# Patient Record
Sex: Female | Born: 1955 | Race: White | Hispanic: No | Marital: Married | Smoking: Never smoker
Health system: Southern US, Community
[De-identification: ages and names within clinical notes are randomized; demographics above are authoritative.]

## PROBLEM LIST (undated history)

## (undated) DIAGNOSIS — C73 Malignant neoplasm of thyroid gland: Secondary | ICD-10-CM

## (undated) DIAGNOSIS — I10 Essential (primary) hypertension: Secondary | ICD-10-CM

## (undated) HISTORY — PX: TOTAL THYROIDECTOMY: SHX2547

## (undated) HISTORY — PX: CHOLECYSTECTOMY: SHX55

## (undated) HISTORY — PX: ABDOMINAL HYSTERECTOMY: SHX81

## (undated) HISTORY — PX: THYROIDECTOMY: SHX17

---

## 1997-10-25 HISTORY — PX: ABDOMINAL HYSTERECTOMY: SHX81

## 1998-01-16 ENCOUNTER — Ambulatory Visit (HOSPITAL_COMMUNITY): Admission: RE | Admit: 1998-01-16 | Discharge: 1998-01-16 | Payer: Self-pay | Admitting: Obstetrics & Gynecology

## 1998-07-24 ENCOUNTER — Ambulatory Visit (HOSPITAL_COMMUNITY): Admission: RE | Admit: 1998-07-24 | Discharge: 1998-07-24 | Payer: Self-pay | Admitting: Obstetrics & Gynecology

## 1998-12-29 ENCOUNTER — Other Ambulatory Visit: Admission: RE | Admit: 1998-12-29 | Discharge: 1998-12-29 | Payer: Self-pay | Admitting: Obstetrics & Gynecology

## 1999-12-03 ENCOUNTER — Encounter: Payer: Self-pay | Admitting: Obstetrics & Gynecology

## 1999-12-03 ENCOUNTER — Ambulatory Visit (HOSPITAL_COMMUNITY): Admission: RE | Admit: 1999-12-03 | Discharge: 1999-12-03 | Payer: Self-pay | Admitting: Obstetrics & Gynecology

## 2000-02-24 ENCOUNTER — Other Ambulatory Visit: Admission: RE | Admit: 2000-02-24 | Discharge: 2000-02-24 | Payer: Self-pay | Admitting: Obstetrics & Gynecology

## 2001-06-21 ENCOUNTER — Encounter: Payer: Self-pay | Admitting: Obstetrics & Gynecology

## 2001-06-21 ENCOUNTER — Ambulatory Visit (HOSPITAL_COMMUNITY): Admission: RE | Admit: 2001-06-21 | Discharge: 2001-06-21 | Payer: Self-pay | Admitting: Obstetrics & Gynecology

## 2001-07-05 ENCOUNTER — Other Ambulatory Visit: Admission: RE | Admit: 2001-07-05 | Discharge: 2001-07-05 | Payer: Self-pay | Admitting: Obstetrics & Gynecology

## 2002-07-17 ENCOUNTER — Encounter: Payer: Self-pay | Admitting: Obstetrics & Gynecology

## 2002-07-17 ENCOUNTER — Ambulatory Visit (HOSPITAL_COMMUNITY): Admission: RE | Admit: 2002-07-17 | Discharge: 2002-07-17 | Payer: Self-pay | Admitting: Obstetrics & Gynecology

## 2002-08-06 ENCOUNTER — Other Ambulatory Visit: Admission: RE | Admit: 2002-08-06 | Discharge: 2002-08-06 | Payer: Self-pay | Admitting: Obstetrics & Gynecology

## 2003-01-03 ENCOUNTER — Encounter (INDEPENDENT_AMBULATORY_CARE_PROVIDER_SITE_OTHER): Payer: Self-pay

## 2003-01-03 ENCOUNTER — Observation Stay (HOSPITAL_COMMUNITY): Admission: RE | Admit: 2003-01-03 | Discharge: 2003-01-04 | Payer: Self-pay | Admitting: Obstetrics & Gynecology

## 2003-07-19 ENCOUNTER — Encounter: Payer: Self-pay | Admitting: Obstetrics & Gynecology

## 2003-07-19 ENCOUNTER — Ambulatory Visit (HOSPITAL_COMMUNITY): Admission: RE | Admit: 2003-07-19 | Discharge: 2003-07-19 | Payer: Self-pay | Admitting: Obstetrics & Gynecology

## 2003-09-03 ENCOUNTER — Other Ambulatory Visit: Admission: RE | Admit: 2003-09-03 | Discharge: 2003-09-03 | Payer: Self-pay | Admitting: Obstetrics & Gynecology

## 2004-05-30 ENCOUNTER — Emergency Department (HOSPITAL_COMMUNITY): Admission: EM | Admit: 2004-05-30 | Discharge: 2004-05-30 | Payer: Self-pay | Admitting: Emergency Medicine

## 2004-11-13 ENCOUNTER — Ambulatory Visit (HOSPITAL_COMMUNITY): Admission: RE | Admit: 2004-11-13 | Discharge: 2004-11-13 | Payer: Self-pay | Admitting: Obstetrics & Gynecology

## 2004-12-11 ENCOUNTER — Other Ambulatory Visit: Admission: RE | Admit: 2004-12-11 | Discharge: 2004-12-11 | Payer: Self-pay | Admitting: Obstetrics & Gynecology

## 2005-03-12 ENCOUNTER — Encounter: Admission: RE | Admit: 2005-03-12 | Discharge: 2005-03-12 | Payer: Self-pay | Admitting: Emergency Medicine

## 2005-12-21 ENCOUNTER — Ambulatory Visit (HOSPITAL_COMMUNITY): Admission: RE | Admit: 2005-12-21 | Discharge: 2005-12-21 | Payer: Self-pay | Admitting: Obstetrics & Gynecology

## 2006-02-07 ENCOUNTER — Other Ambulatory Visit: Admission: RE | Admit: 2006-02-07 | Discharge: 2006-02-07 | Payer: Self-pay | Admitting: Obstetrics & Gynecology

## 2007-01-17 ENCOUNTER — Ambulatory Visit (HOSPITAL_COMMUNITY): Admission: RE | Admit: 2007-01-17 | Discharge: 2007-01-17 | Payer: Self-pay | Admitting: Obstetrics & Gynecology

## 2007-08-03 ENCOUNTER — Encounter: Admission: RE | Admit: 2007-08-03 | Discharge: 2007-08-03 | Payer: Self-pay | Admitting: Emergency Medicine

## 2008-03-29 ENCOUNTER — Ambulatory Visit (HOSPITAL_COMMUNITY): Admission: RE | Admit: 2008-03-29 | Discharge: 2008-03-29 | Payer: Self-pay | Admitting: Obstetrics & Gynecology

## 2010-07-14 ENCOUNTER — Encounter (HOSPITAL_COMMUNITY)
Admission: RE | Admit: 2010-07-14 | Discharge: 2010-10-12 | Payer: Self-pay | Source: Home / Self Care | Attending: Internal Medicine | Admitting: Internal Medicine

## 2010-07-28 ENCOUNTER — Other Ambulatory Visit: Admission: RE | Admit: 2010-07-28 | Discharge: 2010-07-28 | Payer: Self-pay | Admitting: Interventional Radiology

## 2010-07-28 ENCOUNTER — Encounter: Admission: RE | Admit: 2010-07-28 | Discharge: 2010-07-28 | Payer: Self-pay | Admitting: Internal Medicine

## 2010-09-28 ENCOUNTER — Observation Stay (HOSPITAL_COMMUNITY)
Admission: RE | Admit: 2010-09-28 | Discharge: 2010-09-29 | Payer: Self-pay | Source: Home / Self Care | Admitting: Surgery

## 2010-09-28 ENCOUNTER — Encounter (INDEPENDENT_AMBULATORY_CARE_PROVIDER_SITE_OTHER): Payer: Self-pay | Admitting: Surgery

## 2010-10-28 LAB — BASIC METABOLIC PANEL
BUN: 8 mg/dL (ref 6–23)
CO2: 31 mEq/L (ref 19–32)
Calcium: 9.2 mg/dL (ref 8.4–10.5)
Chloride: 101 mEq/L (ref 96–112)
Creatinine, Ser: 0.64 mg/dL (ref 0.4–1.2)
GFR calc Af Amer: >60 mL/min (ref >60)
GFR calc non Af Amer: >60 mL/min (ref >60)
Glucose, Bld: 91 mg/dL (ref 70–99)
Potassium: 3.9 mEq/L (ref 3.5–5.1)
Sodium: 138 mEq/L (ref 135–145)

## 2010-10-28 LAB — CBC
HCT: 37 % (ref 36.0–46.0)
Hemoglobin: 11.9 g/dL — ABNORMAL LOW (ref 12.0–15.0)
MCH: 29 pg (ref 26.0–34.0)
MCHC: 32.2 g/dL (ref 30.0–36.0)
MCV: 90.2 fL (ref 78.0–100.0)
Platelets: 233 10*3/uL (ref 150–400)
RBC: 4.1 MIL/uL (ref 3.87–5.11)
RDW: 13 % (ref 11.5–15.5)
WBC: 6 10*3/uL (ref 4.0–10.5)

## 2010-10-30 ENCOUNTER — Ambulatory Visit (HOSPITAL_COMMUNITY)
Admission: RE | Admit: 2010-10-30 | Discharge: 2010-10-31 | Payer: Self-pay | Source: Home / Self Care | Attending: Surgery | Admitting: Surgery

## 2010-10-30 ENCOUNTER — Encounter (INDEPENDENT_AMBULATORY_CARE_PROVIDER_SITE_OTHER): Payer: Self-pay | Admitting: Surgery

## 2010-11-09 LAB — CALCIUM
Calcium: 8.4 mg/dL (ref 8.4–10.5)
Calcium: 8.3 mg/dL — ABNORMAL LOW (ref 8.4–10.5)

## 2010-11-15 ENCOUNTER — Encounter: Payer: Self-pay | Admitting: Emergency Medicine

## 2010-11-15 NOTE — Op Note (Signed)
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  PROCEDURE:  Completion thyroidectomy (right thyroid lobe and isthmus).  SURGEON:  Velora Heckler, MD  ANESTHESIA:  General per Dr. Judie Petit.  ESTIMATED BLOOD LOSS:  Minimal.  PREPARATION:  ChloraPrep.  COMPLICATIONS:  None.  INDICATIONS:  The patient is a 55 year old white female from [place], [place].  The patient had undergone resection of a large left thyroid mass 1 month ago.  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  After review with the pathologist and the medical specialist, a decision was made to proceed with completion thyroidectomy in anticipation of adjuvant treatment with radioactive iodine.  The patient now comes to surgery for completion thyroidectomy.  BODY OF REPORT:  Procedure was done in OR #3 at the [facility].  The patient was brought to the operating room and placed in supine position on the operating room table.  Following administration of general anesthesia, the patient was positioned and then prepped and draped in the usual strict aseptic fashion.  After ascertaining that an adequate level of anesthesia had been achieved, the patient's previous Kocher incision was reopened with a #15 blade. Dissection was carried through the subcutaneous tissues.  Suture material was divided.  Subplatysmal space was reopened and of a Mahorner self-retaining retractor was placed for exposure.  Strap muscles  were incised in the midline.  Dissection was begun on the right side.  Strap muscles were reflected laterally.  Right thyroid lobe was exposed. Gland was gently mobilized with blunt dissection.  Superior pole vessels were divided between small and medium hemoclips with the harmonic scalpel.  Superior parathyroid gland was identified and preserved on its vascular pedicle.  Branches of the inferior thyroid artery were divided between small Ligaclips with the harmonic scalpel.  Inferior venous tributaries were divided between small and medium Ligaclips with the harmonic scalpel.  Inferior parathyroid gland was also positively identified and preserved.  Inferior thyroid artery was dissected out and the trunk of the artery was divided between medium Ligaclips with the harmonic scalpel.  Recurrent laryngeal nerve was identified and preserved.  Ligament of Allyson Sabal was released with the electrocautery and the lobe was mobilized up and onto the anterior trachea.  Isthmus was mobilized across the midline to an area of scar on the left trachea consistent with the site of previous resection.  The entire right lobe and isthmus was resected and submitted as specimen to Pathology for review.  Neck was irrigated with warm saline.  Surgicel was placed in the operative field.  Strap muscles were reapproximated in the midline with interrupted 3-0 Vicryl sutures.  Platysma was  closed with interrupted 3- 0 Vicryl sutures.  Skin was closed with a running 4-0 Monocryl subcuticular suture.  Wound was washed and dried, and benzoin and Steri- Strips were applied.  Sterile dressings were applied.  The patient was awakened from anesthesia and brought to the recovery room.  The patient tolerated the procedure well.     Velora Heckler, MD     TMG/MEDQ  D:  10/30/2010  T:  10/31/2010  Job:  [id number]  cc:   Larina Earthly, M.D. Tera Mater. Evlyn Kanner, M.D.  Electronically Signed by Darnell Level MD on 11/15/2010 09:59:12  AM

## 2010-11-19 LAB — SURGICAL PCR SCREEN
MRSA, PCR: NEGATIVE
Staphylococcus aureus: NEGATIVE

## 2010-12-24 ENCOUNTER — Other Ambulatory Visit (HOSPITAL_COMMUNITY): Payer: Self-pay | Admitting: Internal Medicine

## 2010-12-24 DIAGNOSIS — C73 Malignant neoplasm of thyroid gland: Secondary | ICD-10-CM

## 2010-12-29 ENCOUNTER — Encounter (HOSPITAL_COMMUNITY)
Admission: RE | Admit: 2010-12-29 | Discharge: 2010-12-29 | Disposition: A | Discharge status: Home / Self Care | Payer: BC Managed Care – PPO | Source: Ambulatory Visit | Attending: Internal Medicine | Admitting: Internal Medicine

## 2010-12-29 DIAGNOSIS — C73 Malignant neoplasm of thyroid gland: Secondary | ICD-10-CM | POA: Insufficient documentation

## 2011-01-01 ENCOUNTER — Encounter (HOSPITAL_COMMUNITY): Payer: Self-pay

## 2011-01-01 ENCOUNTER — Other Ambulatory Visit (HOSPITAL_COMMUNITY): Payer: Self-pay | Admitting: Internal Medicine

## 2011-01-01 ENCOUNTER — Encounter (HOSPITAL_COMMUNITY)
Admission: RE | Admit: 2011-01-01 | Discharge: 2011-01-01 | Disposition: A | Discharge status: Home / Self Care | Payer: BC Managed Care – PPO | Source: Ambulatory Visit | Attending: Internal Medicine | Admitting: Internal Medicine

## 2011-01-01 DIAGNOSIS — C73 Malignant neoplasm of thyroid gland: Secondary | ICD-10-CM

## 2011-01-01 DIAGNOSIS — E0789 Other specified disorders of thyroid: Secondary | ICD-10-CM | POA: Insufficient documentation

## 2011-01-01 HISTORY — DX: Malignant neoplasm of thyroid gland: C73

## 2011-01-01 MED ORDER — SODIUM IODIDE I 131 CAPSULE
3.2000 | Freq: Once | INTRAVENOUS | Status: AC | PRN
  Administered 2010-12-31: 3.2 via ORAL

## 2011-01-05 LAB — DIFFERENTIAL
Basophils Absolute: 0 10*3/uL (ref 0.0–0.1)
Basophils Relative: 0 % (ref 0–1)
Eosinophils Absolute: 0.1 10*3/uL (ref 0.0–0.7)
Eosinophils Relative: 2 % (ref 0–5)
Lymphocytes Relative: 26 % (ref 12–46)
Lymphs Abs: 1.6 10*3/uL (ref 0.7–4.0)
Monocytes Absolute: 0.4 10*3/uL (ref 0.1–1.0)
Monocytes Relative: 7 % (ref 3–12)
Neutro Abs: 4.1 10*3/uL (ref 1.7–7.7)
Neutrophils Relative %: 66 % (ref 43–77)

## 2011-01-05 LAB — BASIC METABOLIC PANEL
BUN: 9 mg/dL (ref 6–23)
CO2: 30 mEq/L (ref 19–32)
Calcium: 9.2 mg/dL (ref 8.4–10.5)
Chloride: 98 mEq/L (ref 96–112)
Creatinine, Ser: 0.59 mg/dL (ref 0.4–1.2)
GFR calc Af Amer: >60 mL/min (ref >60)
GFR calc non Af Amer: >60 mL/min (ref >60)
Glucose, Bld: 95 mg/dL (ref 70–99)
Potassium: 3.5 mEq/L (ref 3.5–5.1)
Sodium: 137 mEq/L (ref 135–145)

## 2011-01-05 LAB — CBC
HCT: 34.8 % — ABNORMAL LOW (ref 36.0–46.0)
Hemoglobin: 11.9 g/dL — ABNORMAL LOW (ref 12.0–15.0)
MCH: 30.2 pg (ref 26.0–34.0)
MCHC: 34.2 g/dL (ref 30.0–36.0)
MCV: 88.4 fL (ref 78.0–100.0)
Platelets: 255 10*3/uL (ref 150–400)
RBC: 3.94 MIL/uL (ref 3.87–5.11)
RDW: 12.3 % (ref 11.5–15.5)
WBC: 6.2 10*3/uL (ref 4.0–10.5)

## 2011-01-05 LAB — SURGICAL PCR SCREEN
MRSA, PCR: NEGATIVE
Staphylococcus aureus: NEGATIVE

## 2011-01-05 LAB — PROTIME-INR
INR: 0.94 (ref 0.00–1.49)
Prothrombin Time: 12.8 seconds (ref 11.6–15.2)

## 2011-01-05 LAB — URINALYSIS, ROUTINE W REFLEX MICROSCOPIC
Bilirubin Urine: NEGATIVE
Glucose, UA: NEGATIVE mg/dL
Hgb urine dipstick: NEGATIVE
Ketones, ur: NEGATIVE mg/dL
Nitrite: NEGATIVE
Protein, ur: NEGATIVE mg/dL
Specific Gravity, Urine: 1.016 (ref 1.005–1.030)
Urobilinogen, UA: 0.2 mg/dL (ref 0.0–1.0)
pH: 6 (ref 5.0–8.0)

## 2011-01-06 ENCOUNTER — Encounter (HOSPITAL_COMMUNITY)
Admission: RE | Admit: 2011-01-06 | Discharge: 2011-01-06 | Disposition: A | Discharge status: Home / Self Care | Payer: BC Managed Care – PPO | Source: Ambulatory Visit | Attending: Internal Medicine | Admitting: Internal Medicine

## 2011-01-06 ENCOUNTER — Encounter (HOSPITAL_COMMUNITY): Payer: Self-pay

## 2011-01-06 DIAGNOSIS — C73 Malignant neoplasm of thyroid gland: Secondary | ICD-10-CM | POA: Insufficient documentation

## 2011-01-06 MED ORDER — SODIUM IODIDE I 131 CAPSULE
150.0000 | Freq: Once | INTRAVENOUS | Status: AC | PRN
  Administered 2011-01-06: 150.4 via ORAL

## 2011-01-15 ENCOUNTER — Ambulatory Visit (HOSPITAL_COMMUNITY)
Admission: RE | Admit: 2011-01-15 | Discharge: 2011-01-15 | Disposition: A | Discharge status: Home / Self Care | Payer: BC Managed Care – PPO | Source: Ambulatory Visit | Attending: Internal Medicine | Admitting: Internal Medicine

## 2011-01-15 DIAGNOSIS — C73 Malignant neoplasm of thyroid gland: Secondary | ICD-10-CM | POA: Insufficient documentation

## 2011-03-12 NOTE — Op Note (Signed)
NAME:  Yvonne Morris, Yvonne Morris                        ACCOUNT NO.:  [id number]   MEDICAL RECORD NO.:  [id number]                   PATIENT TYPE:  OBV   LOCATION:  9322                                 [facility]   PHYSICIAN:  Freddy Finner, M.D.                DATE OF BIRTH:  1956/09/19   DATE OF PROCEDURE:  01/03/2003  DATE OF DISCHARGE:                                 OPERATIVE REPORT   PREOPERATIVE DIAGNOSES:  Fibroids, dysfunctional bleeding.   POSTOPERATIVE DIAGNOSES:  Fibroids, dysfunctional bleeding, the weight of  the uterus was 298 g.   OPERATIVE PROCEDURE:  Laparoscopically assisted vaginal  hysterectomy/bilateral salpingo-oophorectomy.   SURGEON:  Freddy Finner, M.D.   ASSISTANT:  Tracie Harrier, M.D.   ANESTHESIA:  General endotracheal.   ESTIMATED INTRAOPERATIVE BLOOD LOSS:  100 mL   INTRAOPERATIVE COMPLICATIONS:  None.   PRESENT ILLNESS:  Details of the present illness recorded in admission note.   DESCRIPTION OF PROCEDURE:  The patient was admitted on the morning of  surgery.  She was placed in PAS hose.  She was given an antibiotic bolus.  She was brought to the operating room, placed under adequate general  anesthesia, placed in dorsal lithotomy position using the [place] stirrup  system.  Betadine prep of abdomen, perineum and vagina was carried out in  the usual fashion with scrub followed by solution.  Hawkin tenaculum was  attached to the cervix under direct visualization.  Bladder was evacuated  with a Robinson catheter.  Sterile drapes were applied.  Two small incisions  were made, one at the umbilicus, one just above the symphysis through the  umbilical incision and 11 mm disposable non__________ trocar was introduced  while elevating the anterior abdominal wall manually.  Direct inspection  revealed adequate placement with no evidence of injury on entry.  Hemoperitoneum was allowed to accumulate into carbon dioxide gas.  A 5 mm  trocar was placed  in the lower incision under direct visualization.  Third,  a grasping forceps was used, systematic examination of the bowel and pelvic  contents was carried out, no abdominal abnormalities were noted including a  normal appendix.  The uterus was enlarged, the tubes and ovaries were  normal, there was a small peritoneal cyst on the right uterosacral ligament  which is photographed and retained in the office record along with other  findings and this was not removed.  The Gyrus bipolar cutting forceps was  used through the operating channel of the laparoscope and the  infundibulopelvic and upper abdominal ligaments were progressively  fulgurated and sharply divided to free the tubes and ovaries.  This was  carried down to the level just above the uterine arteries.  Attention was  then turned vaginally.  Posterior wedge retractor was placed.  Cervix was  grasped with Christella Hartigan tenaculum after removing the Hawkin tenaculum.  Colpotomy incision was made while turning the uterus  posterior to the  cervix.  Cervix was circumscribed with a scalpel.  Uterosacral pedicles were  taken with the Gyrus Heaney-type clamp, sealed and divided.  Lateral pillars  were taken separately, sealed and divided.  Bladder was carefully advanced  off the cervix.  Cardinal ligament and pedicles were taken, sealed and  divided.  Anterior cul-de-sac was entered and this was confirmed.  Uterine  artery pedicles were then taken with the Gyrus system as well as the pedicle  just above the uterine arteries.  The uterus was then delivered through the  vaginal introitus without significant difficulty.  The weight of the uterus  was 298 g.  A remaining pedicle was sealed and divided.  Angles of the  vagina were then anchored to uterosacrals with a mattress suture of 0-  Monocryl.  Uterosacrals were plicated.  Posterior peritoneum closed with an  interrupted 0-Monocryl.  Cuff was closed vertically with figure-of-eight 0-  Monocryl.   Reinspection laparoscopically was then carried out using the  [place] irrigating system.  Pedicles were irrigated, complete hemostasis was  noted, all the irrigation instruments were aspirated from the abdomen.  The  gas was allowed to escape from the abdomen, instruments were removed, the  patient was given a 30 mg bolus of Toradol IV, 8 mL of half percent plain  Marcaine were injected into the incision sites.  The incisions were closed  with interrupted subcuticular sutures and 3-0 Dexon.  Steri-strips were  applied to the lower incision.  The patient tolerated the operative  procedure well and was awakened and taken to recovery in good condition.                                               Freddy Finner, M.D.    WRN/MEDQ  D:  01/03/2003  T:  01/03/2003  Job:  [id number]

## 2011-03-12 NOTE — H&P (Signed)
NAME:  Yvonne Morris, Yvonne Morris                        ACCOUNT NO.:  [id number]   MEDICAL RECORD NO.:  [id number]                   PATIENT TYPE:  AMB   LOCATION:  SDC                                  [facility]   PHYSICIAN:  Freddy Finner, M.D.                DATE OF BIRTH:  [birth year]   DATE OF ADMISSION:  DATE OF DISCHARGE:                                HISTORY & PHYSICAL   ADMISSION DIAGNOSES:  1. Multiple uterine myomata.  2. Menorrhagia, unresponsive to prolonged use of oral contraceptives with     irregular monthly cycle and prolonged continuous use of active pills.   HISTORY OF PRESENT ILLNESS:  The patient is a 55 year old white married  female, Gravida I, Para I, who has had extensive problems with menorrhagia  and vasomotor symptoms. She was diagnosed with hypothyroidism and a nodule  and is currently under treatment with Dr. Bing Matter in [place] and is  treated with Synthroid. She is also known to have hypertension for which she  is treated with Prinizide. She also is taking Paxil 20 mg per day for mild  depression. She has been managed in my office with continuous oral  contraceptive use and with intermittent routine cyclic oral contraceptives  with no resolution of her menorrhagia, in spite of her good control of  thyroid function at this point in time. She has been documented by  ultrasound to have numerous fibroids including some as large as 2.5 cm. She  has requested definitive surgical intervention. She is admitted at this time  for laparoscopically assisted vaginal hysterectomy and bilateral salpingo-  oophorectomy.   REVIEW OF SYSTEMS:  Otherwise negative with no cardiopulmonary, GI or GU  complaints.   PAST MEDICAL HISTORY:  Remarkable for as above. She has had one vaginal  delivery in 1988. She has no other non-medical conditions other than those  presented in the history of present illness. She has never had a blood  transfusion.   CURRENT MEDICATIONS:   Are all listed above also.   SOCIAL HISTORY:  She is not a cigarette smoker.   FAMILY HISTORY:  Noncontributory.   PHYSICAL EXAMINATION:  HEENT: Grossly within normal limits.  NECK: Thyroid gland is palpable and enlarged with nodularity on the left.  VITAL SIGNS: Blood pressure in the office is 140/94.  BREAST: Examination normal. No palpable masses. No skin changes or nipple  discharge.  CHEST: Clear to auscultation throughout.  HEART: Normal sinus rhythm without murmur, rub, or gallop.  ABDOMEN: Soft and nontender. No appreciable organomegaly. The patient is  moderately obese.  PELVIC: Examination reveals the uterus to be slightly enlarged by bimanual  palpation but the examination was compromised by body habitus. There are no  palpable rectal lesions. No palpable adnexal lesions.  EXTREMITIES: Without clubbing, cyanosis, or edema.    IMPRESSION:  Uterine leiomyomata, menorrhagia, unresponsive to conservative  hormonal therapy. Pelvic ultrasound shows numerous intramural  and subserosal  myomas but no submucous myomas and no apparent abnormality of the  endometrium.   PLAN:  Laparoscopic assisted vaginal hysterectomy with bilateral salpingo-  oophorectomy.                                               Freddy Finner, M.D.    WRN/MEDQ  D:  01/02/2003  T:  01/02/2003  Job:  [id number]

## 2011-08-18 ENCOUNTER — Encounter (INDEPENDENT_AMBULATORY_CARE_PROVIDER_SITE_OTHER): Payer: Self-pay | Admitting: Surgery

## 2011-08-23 ENCOUNTER — Encounter (INDEPENDENT_AMBULATORY_CARE_PROVIDER_SITE_OTHER): Payer: Self-pay | Admitting: Surgery

## 2011-08-30 ENCOUNTER — Encounter (INDEPENDENT_AMBULATORY_CARE_PROVIDER_SITE_OTHER): Payer: Self-pay | Admitting: Surgery

## 2011-08-30 ENCOUNTER — Ambulatory Visit (INDEPENDENT_AMBULATORY_CARE_PROVIDER_SITE_OTHER): Payer: BC Managed Care – PPO | Admitting: Surgery

## 2011-08-30 VITALS — BP 146/90 | HR 76 | Temp 98.0°F | Resp 16 | Ht 67.0 in | Wt 203.4 lb

## 2011-08-30 DIAGNOSIS — D497 Neoplasm of unspecified behavior of endocrine glands and other parts of nervous system: Secondary | ICD-10-CM

## 2011-08-30 DIAGNOSIS — D34 Benign neoplasm of thyroid gland: Secondary | ICD-10-CM | POA: Insufficient documentation

## 2011-08-30 NOTE — Patient Instructions (Signed)
  COCOA BUTTER & VITAMIN E CREAM  ([employer]mer[employer] or other brand)  Apply cocoa butter/vitamin E cream to your incision 2 - 3 times daily.  Massage cream into incision for one minute with each application.  Use sunscreen (50 SPF or higher) for first 6 months after surgery.  You may substitute Mederma or other scar reducing creams as desired.   

## 2011-08-30 NOTE — Progress Notes (Signed)
Visit Diagnoses: 1. Hurthle cell neoplasm of thyroid, minimally invasive     HISTORY: Patient is a 55 year old white female who underwent a thyroidectomy for a minimally invasive Hurthle cell neoplasm of the thyroid. She received adjuvant radioactive iodine in March 2012. She had a dose of 150 mCi. She is currently taking Synthroid 175 mcg daily. A followup body scan in March 2012 showed no evidence of metastatic disease. Overall the patient continues to do well.   PERTINENT REVIEW OF SYSTEMS: Patient denies tremors. She denies palpitations. She denies dysphagia. Voice quality is normal.   EXAM: HEENT: normocephalic; pupils equal and reactive; sclerae clear; dentition good; mucous membranes moist NECK:  Palpation in the thyroid bed shows no nodularity and no mass; symmetric on extension; no palpable anterior or posterior cervical lymphadenopathy; no supraclavicular masses; no tenderness CHEST: clear to auscultation bilaterally without rales, rhonchi, or wheezes CARDIAC: regular rate and rhythm without significant murmur; peripheral pulses are full EXT:  non-tender without edema; no deformity NEURO: no gross focal deficits; no sign of tremor   IMPRESSION: Minimally invasive cell neoplasm, no evidence of recurrent disease   PLAN: The patient and I discussed the above findings. She is on a stable dose of Synthroid at 175 mcg with a recent TSH level of 0.61.  It has only been 9 months since her radioactive iodine treatment. I think I would wait one year before repeating her total body iodine scan. I think her risk of recurrence as well. She should have a thyroglobulin levels followed intermittently. This results should be near zero.  Patient will return to see me for followup in one year. We will review the results of her total body iodine scan at that time.   Velora Heckler, MD, FACS General & Endocrine Surgery [facility]

## 2011-10-15 ENCOUNTER — Encounter (INDEPENDENT_AMBULATORY_CARE_PROVIDER_SITE_OTHER): Payer: Self-pay

## 2011-12-08 ENCOUNTER — Other Ambulatory Visit: Payer: Self-pay | Admitting: Obstetrics & Gynecology

## 2011-12-08 DIAGNOSIS — R928 Other abnormal and inconclusive findings on diagnostic imaging of breast: Secondary | ICD-10-CM

## 2011-12-17 ENCOUNTER — Ambulatory Visit
Admission: RE | Admit: 2011-12-17 | Discharge: 2011-12-17 | Disposition: A | Discharge status: Home / Self Care | Payer: BC Managed Care – PPO | Source: Ambulatory Visit | Attending: Obstetrics & Gynecology | Admitting: Obstetrics & Gynecology

## 2011-12-17 DIAGNOSIS — R928 Other abnormal and inconclusive findings on diagnostic imaging of breast: Secondary | ICD-10-CM

## 2011-12-22 ENCOUNTER — Encounter (INDEPENDENT_AMBULATORY_CARE_PROVIDER_SITE_OTHER): Payer: Self-pay

## 2012-03-07 ENCOUNTER — Other Ambulatory Visit: Payer: Self-pay | Admitting: *Deleted

## 2012-03-07 ENCOUNTER — Other Ambulatory Visit (INDEPENDENT_AMBULATORY_CARE_PROVIDER_SITE_OTHER): Payer: Self-pay | Admitting: General Surgery

## 2012-03-07 ENCOUNTER — Ambulatory Visit
Admission: RE | Admit: 2012-03-07 | Discharge: 2012-03-07 | Disposition: A | Discharge status: Home / Self Care | Payer: BC Managed Care – PPO | Source: Ambulatory Visit | Attending: Internal Medicine | Admitting: Internal Medicine

## 2012-03-07 ENCOUNTER — Inpatient Hospital Stay (HOSPITAL_COMMUNITY)
Admission: AD | Admit: 2012-03-07 | Discharge: 2012-03-10 | DRG: 493 | Disposition: A | Discharge status: Home / Self Care | Payer: BC Managed Care – PPO | Source: Ambulatory Visit | Attending: General Surgery | Admitting: General Surgery

## 2012-03-07 ENCOUNTER — Encounter (HOSPITAL_COMMUNITY): Payer: Self-pay | Admitting: *Deleted

## 2012-03-07 DIAGNOSIS — Z9089 Acquired absence of other organs: Secondary | ICD-10-CM

## 2012-03-07 DIAGNOSIS — R1011 Right upper quadrant pain: Secondary | ICD-10-CM

## 2012-03-07 DIAGNOSIS — I10 Essential (primary) hypertension: Secondary | ICD-10-CM | POA: Diagnosis present

## 2012-03-07 DIAGNOSIS — D72829 Elevated white blood cell count, unspecified: Secondary | ICD-10-CM | POA: Diagnosis present

## 2012-03-07 DIAGNOSIS — Z85850 Personal history of malignant neoplasm of thyroid: Secondary | ICD-10-CM

## 2012-03-07 DIAGNOSIS — R112 Nausea with vomiting, unspecified: Secondary | ICD-10-CM

## 2012-03-07 DIAGNOSIS — K810 Acute cholecystitis: Secondary | ICD-10-CM

## 2012-03-07 DIAGNOSIS — F3289 Other specified depressive episodes: Secondary | ICD-10-CM | POA: Diagnosis present

## 2012-03-07 DIAGNOSIS — Z87891 Personal history of nicotine dependence: Secondary | ICD-10-CM

## 2012-03-07 DIAGNOSIS — F329 Major depressive disorder, single episode, unspecified: Secondary | ICD-10-CM | POA: Diagnosis present

## 2012-03-07 DIAGNOSIS — E876 Hypokalemia: Secondary | ICD-10-CM | POA: Diagnosis present

## 2012-03-07 MED ORDER — KCL IN DEXTROSE-NACL 20-5-0.9 MEQ/L-%-% IV SOLN
INTRAVENOUS | Status: DC
  Administered 2012-03-07: 19:00:00 via INTRAVENOUS
  Administered 2012-03-08: 100 mL/h via INTRAVENOUS
  Filled 2012-03-07 (×3): qty 1000

## 2012-03-07 MED ORDER — MORPHINE SULFATE 2 MG/ML IJ SOLN
2.0000 mg | INTRAMUSCULAR | Status: DC | PRN
  Administered 2012-03-07: 6 mg via INTRAVENOUS
  Administered 2012-03-07: 4 mg via INTRAVENOUS
  Administered 2012-03-07: 2 mg via INTRAVENOUS
  Administered 2012-03-08 (×3): 6 mg via INTRAVENOUS
  Filled 2012-03-07 (×4): qty 3
  Filled 2012-03-07: qty 2
  Filled 2012-03-07: qty 1

## 2012-03-07 MED ORDER — PANTOPRAZOLE SODIUM 40 MG IV SOLR
40.0000 mg | Freq: Every day | INTRAVENOUS | Status: DC
  Administered 2012-03-07 – 2012-03-08 (×2): 40 mg via INTRAVENOUS
  Filled 2012-03-07 (×3): qty 40

## 2012-03-07 MED ORDER — ONDANSETRON HCL 4 MG/2ML IJ SOLN: 4.0000 mg | Freq: Four times a day (QID) | INTRAMUSCULAR | Status: DC | PRN

## 2012-03-07 MED ORDER — SODIUM CHLORIDE 0.9 % IV SOLN
3.0000 g | Freq: Four times a day (QID) | INTRAVENOUS | Status: DC
  Administered 2012-03-07 – 2012-03-10 (×11): 3 g via INTRAVENOUS
  Filled 2012-03-07 (×13): qty 3

## 2012-03-07 NOTE — H&P (Signed)
Yvonne Morris is an 56 y.o. female.   Chief Complaint:  Right upper quadrant pain with fever and nausea HPI:   [name] who had the onset of epigastric pain on May 12 at 4 PM. She was driving [place] from the beach. The pain persisted and she had nausea and dry heaves for the next 24 hours. She presented to Dr. Vicente Males office for evaluation. She was noted to have a leukocytosis and right upper quadrant tenderness and a Murphy's sign. She underwent ultrasound demonstrating multiple gallstones and a slightly dilated common bile duct. There was a positive Murphy sign during the ultrasound as well. Her liver function tests were normal. She is now admitted with a diagnosis of acute cholecystitis.  Past Medical History  Diagnosis Date  . Thyroid cancer     Hypertension   Depression  Past Surgical History  Procedure Date  . Abdominal hysterectomy 1999  . Total thyroidectomy 2011, 2012    x2    Family History  Problem Relation Age of Onset  . Stroke Father    Social History:  reports that she has quit smoking. She does not have any smokeless tobacco history on file. She reports that she drinks alcohol. She reports that she does not use illicit drugs.  Allergies: No Known Allergies  Medications Prior to Admission  Medication Sig Dispense Refill  . estradiol (ESTRACE) 2 MG tablet daily.      Marland Kitchen levothyroxine (SYNTHROID, LEVOTHROID) 175 MCG tablet daily.      Marland Kitchen lisinopril-hydrochlorothiazide (PRINZIDE,ZESTORETIC) 20-12.5 MG per tablet daily.      Marland Kitchen PARoxetine (PAXIL) 20 MG tablet daily.        No results found for this or any previous visit (from the past 48 hour(s)). US Abdomen Limited Ruq  03/07/2012  *RADIOLOGY REPORT*  Clinical Data:  Right upper quadrant pain.  Nausea and vomiting. History of thyroid carcinoma.  LIMITED ABDOMINAL ULTRASOUND - RIGHT UPPER QUADRANT  Comparison:  None.  Findings:  Gallbladder:  Cholelithiasis is present.  Largest gallstone measures 22 mm.   No wall thickening.  Positive sonographic Murphy's sign is present.  These two findings together have a high positive predicted value for early acute cholecystitis even in the absence of wall thickening.  Common bile duct:  8.5 mm.  No common duct stone is identified. The common duct is dilated for age.  If the gallbladder is removed, cholangiography is recommended.  Liver:  19 mm Cystic lesion is present in the left hepatic lobe (image 82).  This is indeterminant on ultrasound.  There may be some increased through transmission but based on these images, there appears to be some internal echotexture.  Liver MRI in follow- up is recommended in this patient with a history of malignancy.  IMPRESSION:  1.  Cholelithiasis with positive sonographic Murphy's sign.  This has a high positive predicted value for early acute cholecystitis. 2.  19 mm cystic lesion in the left hepatic lobe is indeterminant. Sonographic images demonstrate equivocal increased through transmission and some internal echoes.  Follow up liver MRI  with and without infusion is recommended in this patient with history of thyroid cancer.  These results will be called to the ordering clinician or representative by the Radiologist Assistant, and communication documented in the PACS Dashboard.                   Original Report Authenticated By: Andreas Newport, M.D.    Review of Systems  Constitutional: Positive  for fever and chills.  HENT: Negative for congestion and sore throat.   Respiratory: Negative for cough and shortness of breath.   Cardiovascular: Negative for chest pain and palpitations.  Gastrointestinal: Positive for nausea and abdominal pain. Negative for diarrhea and constipation.  Genitourinary: Negative for dysuria and urgency.  Neurological: Negative.  Negative for headaches.  Endo/Heme/Allergies: Negative.     There were no vitals taken for this visit. Physical Exam  Constitutional:       Overweight female, appears  uncomfortable.  HENT:  Head: Normocephalic and atraumatic.  Eyes: EOM are normal. No scleral icterus.  Neck: Neck supple.       Lower transverse scar  Cardiovascular: Regular rhythm.        Increased rate.  Respiratory: Effort normal and breath sounds normal.  GI: Soft. She exhibits no mass. There is tenderness (ruq). There is guarding (RUQ).  Musculoskeletal: She exhibits no edema.  Neurological: She is alert.  Skin: Skin is warm and dry.     Assessment/Plan Acute cholecystitis.  Plan: Admit to the hospital. Start IV fluid hydration and IV antibiotics.  Laparoscopic possible open cholecystectomy this admission.  I have explained the procedure, risks, and aftercare of cholecystectomy.  Risks include but are not limited to bleeding, infection, wound problems, anesthesia, diarrhea, bile leak, injury to common bile duct/liver/intestine.  These are slightly increased due to the acute infection.  [name].   Bianey Tesoro J 03/07/2012, 5:30 PM

## 2012-03-08 ENCOUNTER — Encounter (HOSPITAL_COMMUNITY): Admission: AD | Disposition: A | Discharge status: Home / Self Care | Payer: Self-pay | Source: Ambulatory Visit

## 2012-03-08 ENCOUNTER — Encounter (HOSPITAL_COMMUNITY): Payer: Self-pay | Admitting: Anesthesiology

## 2012-03-08 ENCOUNTER — Inpatient Hospital Stay (HOSPITAL_COMMUNITY): Payer: BC Managed Care – PPO

## 2012-03-08 ENCOUNTER — Inpatient Hospital Stay (HOSPITAL_COMMUNITY): Payer: BC Managed Care – PPO | Admitting: Anesthesiology

## 2012-03-08 DIAGNOSIS — K810 Acute cholecystitis: Principal | ICD-10-CM | POA: Diagnosis present

## 2012-03-08 HISTORY — PX: CHOLECYSTECTOMY: SHX55

## 2012-03-08 LAB — COMPREHENSIVE METABOLIC PANEL
ALT: 13 U/L (ref 0–35)
Albumin: 2.8 g/dL — ABNORMAL LOW (ref 3.5–5.2)
Alkaline Phosphatase: 82 U/L (ref 39–117)
Potassium: 2.7 mEq/L — CL (ref 3.5–5.1)
Sodium: 139 mEq/L (ref 135–145)
Total Protein: 6.9 g/dL (ref 6.0–8.3)

## 2012-03-08 LAB — CBC
MCHC: 32 g/dL (ref 30.0–36.0)
Platelets: 255 10*3/uL (ref 150–400)
RDW: 13.4 % (ref 11.5–15.5)

## 2012-03-08 SURGERY — LAPAROSCOPIC CHOLECYSTECTOMY WITH INTRAOPERATIVE CHOLANGIOGRAM
Anesthesia: General | Site: Abdomen | Wound class: Contaminated

## 2012-03-08 MED ORDER — KCL IN DEXTROSE-NACL 40-5-0.9 MEQ/L-%-% IV SOLN
INTRAVENOUS | Status: DC
  Administered 2012-03-08: 100 mL/h via INTRAVENOUS
  Administered 2012-03-09 (×2): via INTRAVENOUS
  Administered 2012-03-10: 1000 mL via INTRAVENOUS
  Filled 2012-03-08 (×9): qty 1000

## 2012-03-08 MED ORDER — BUPIVACAINE-EPINEPHRINE 0.5% -1:200000 IJ SOLN
INTRAMUSCULAR | Status: DC | PRN
  Administered 2012-03-08: 15 mL

## 2012-03-08 MED ORDER — MIDAZOLAM HCL 5 MG/5ML IJ SOLN
INTRAMUSCULAR | Status: DC | PRN
  Administered 2012-03-08: 2 mg via INTRAVENOUS

## 2012-03-08 MED ORDER — OXYCODONE-ACETAMINOPHEN 5-325 MG PO TABS
1.0000 | ORAL_TABLET | ORAL | Status: DC | PRN
  Administered 2012-03-08: 1 via ORAL
  Administered 2012-03-09: 2 via ORAL
  Administered 2012-03-09 (×2): 1 via ORAL
  Administered 2012-03-09 – 2012-03-10 (×3): 2 via ORAL
  Filled 2012-03-08: qty 2
  Filled 2012-03-08: qty 1
  Filled 2012-03-08 (×2): qty 2
  Filled 2012-03-08 (×2): qty 1
  Filled 2012-03-08: qty 2

## 2012-03-08 MED ORDER — POTASSIUM CHLORIDE 10 MEQ/100ML IV SOLN
10.0000 meq | INTRAVENOUS | Status: AC
  Administered 2012-03-08 (×3): 10 meq via INTRAVENOUS
  Filled 2012-03-08 (×5): qty 100

## 2012-03-08 MED ORDER — FENTANYL CITRATE 0.05 MG/ML IJ SOLN
INTRAMUSCULAR | Status: DC | PRN
  Administered 2012-03-08: 100 ug via INTRAVENOUS
  Administered 2012-03-08: 50 ug via INTRAVENOUS
  Administered 2012-03-08: 100 ug via INTRAVENOUS

## 2012-03-08 MED ORDER — LISINOPRIL 20 MG PO TABS
20.0000 mg | ORAL_TABLET | Freq: Every day | ORAL | Status: DC
  Administered 2012-03-08 – 2012-03-10 (×3): 20 mg via ORAL
  Filled 2012-03-08 (×3): qty 1

## 2012-03-08 MED ORDER — LACTATED RINGERS IV SOLN
INTRAVENOUS | Status: DC | PRN
  Administered 2012-03-08 (×2): via INTRAVENOUS

## 2012-03-08 MED ORDER — PROMETHAZINE HCL 25 MG/ML IJ SOLN: 6.2500 mg | INTRAMUSCULAR | Status: DC | PRN

## 2012-03-08 MED ORDER — LEVOTHYROXINE SODIUM 175 MCG PO TABS
175.0000 ug | ORAL_TABLET | Freq: Every day | ORAL | Status: DC
  Administered 2012-03-08 – 2012-03-10 (×3): 175 ug via ORAL
  Filled 2012-03-08 (×4): qty 1

## 2012-03-08 MED ORDER — HYDROCHLOROTHIAZIDE 12.5 MG PO CAPS
12.5000 mg | ORAL_CAPSULE | Freq: Every day | ORAL | Status: DC
  Administered 2012-03-08 – 2012-03-10 (×3): 12.5 mg via ORAL
  Filled 2012-03-08 (×3): qty 1

## 2012-03-08 MED ORDER — LACTATED RINGERS IV SOLN: INTRAVENOUS | Status: DC

## 2012-03-08 MED ORDER — SUCCINYLCHOLINE CHLORIDE 20 MG/ML IJ SOLN
INTRAMUSCULAR | Status: DC | PRN
  Administered 2012-03-08: 100 mg via INTRAVENOUS

## 2012-03-08 MED ORDER — CISATRACURIUM BESYLATE (PF) 10 MG/5ML IV SOLN
INTRAVENOUS | Status: DC | PRN
  Administered 2012-03-08: 5 mg via INTRAVENOUS

## 2012-03-08 MED ORDER — 0.9 % SODIUM CHLORIDE (POUR BTL) OPTIME
TOPICAL | Status: DC | PRN
  Administered 2012-03-08: 1000 mL

## 2012-03-08 MED ORDER — MEPERIDINE HCL 50 MG/ML IJ SOLN: 6.2500 mg | INTRAMUSCULAR | Status: DC | PRN

## 2012-03-08 MED ORDER — HYDROMORPHONE HCL PF 1 MG/ML IJ SOLN: 0.2500 mg | INTRAMUSCULAR | Status: DC | PRN

## 2012-03-08 MED ORDER — PROPOFOL 10 MG/ML IV EMUL
INTRAVENOUS | Status: DC | PRN
  Administered 2012-03-08: 130 mg via INTRAVENOUS

## 2012-03-08 MED ORDER — LACTATED RINGERS IR SOLN
Status: DC | PRN
  Administered 2012-03-08 (×2): 1

## 2012-03-08 MED ORDER — PAROXETINE HCL 20 MG PO TABS
20.0000 mg | ORAL_TABLET | Freq: Every day | ORAL | Status: DC
  Administered 2012-03-08 – 2012-03-10 (×3): 20 mg via ORAL
  Filled 2012-03-08 (×3): qty 1

## 2012-03-08 MED ORDER — LISINOPRIL-HYDROCHLOROTHIAZIDE 20-12.5 MG PO TABS: 1.0000 | ORAL_TABLET | Freq: Every day | ORAL | Status: DC

## 2012-03-08 MED ORDER — SODIUM CHLORIDE 0.9 % IJ SOLN
INTRAMUSCULAR | Status: DC | PRN
  Administered 2012-03-08: 11:00:00

## 2012-03-08 MED ORDER — ONDANSETRON HCL 4 MG/2ML IJ SOLN
INTRAMUSCULAR | Status: DC | PRN
  Administered 2012-03-08: 4 mg via INTRAVENOUS

## 2012-03-08 MED ORDER — ESTRADIOL 2 MG PO TABS
2.0000 mg | ORAL_TABLET | Freq: Every day | ORAL | Status: DC
  Administered 2012-03-08 – 2012-03-10 (×3): 2 mg via ORAL
  Filled 2012-03-08 (×3): qty 1

## 2012-03-08 SURGICAL SUPPLY — 43 items
APPLIER CLIP 5 13 M/L LIGAMAX5 (MISCELLANEOUS) ×2
APPLIER CLIP ROT 10 11.4 M/L (STAPLE)
BENZOIN TINCTURE PRP APPL 2/3 (GAUZE/BANDAGES/DRESSINGS) ×2 IMPLANT
CANISTER SUCTION 2500CC (MISCELLANEOUS) ×2 IMPLANT
CHLORAPREP W/TINT 26ML (MISCELLANEOUS) ×2 IMPLANT
CLIP APPLIE 5 13 M/L LIGAMAX5 (MISCELLANEOUS) ×1 IMPLANT
CLIP APPLIE ROT 10 11.4 M/L (STAPLE) IMPLANT
CLOTH BEACON ORANGE TIMEOUT ST (SAFETY) ×2 IMPLANT
COVER MAYO STAND STRL (DRAPES) ×2 IMPLANT
COVER SURGICAL LIGHT HANDLE (MISCELLANEOUS) ×2 IMPLANT
DECANTER SPIKE VIAL GLASS SM (MISCELLANEOUS) ×2 IMPLANT
DRAPE C-ARM 42X72 X-RAY (DRAPES) ×2 IMPLANT
DRAPE LAPAROSCOPIC ABDOMINAL (DRAPES) ×2 IMPLANT
DRAPE UTILITY XL STRL (DRAPES) ×2 IMPLANT
DRSG TEGADERM 2-3/8X2-3/4 SM (GAUZE/BANDAGES/DRESSINGS) ×8 IMPLANT
DRSG TEGADERM 4X4.75 (GAUZE/BANDAGES/DRESSINGS) ×2 IMPLANT
ELECT REM PT RETURN 9FT ADLT (ELECTROSURGICAL) ×2
ELECTRODE REM PT RTRN 9FT ADLT (ELECTROSURGICAL) ×1 IMPLANT
ENDOLOOP SUT PDS II  0 18 (SUTURE)
ENDOLOOP SUT PDS II 0 18 (SUTURE) IMPLANT
GLOVE BIOGEL PI IND STRL 7.0 (GLOVE) ×1 IMPLANT
GLOVE BIOGEL PI INDICATOR 7.0 (GLOVE) ×1
GLOVE ECLIPSE 8.0 STRL XLNG CF (GLOVE) ×2 IMPLANT
GLOVE INDICATOR 8.0 STRL GRN (GLOVE) ×2 IMPLANT
GOWN STRL NON-REIN LRG LVL3 (GOWN DISPOSABLE) ×4 IMPLANT
GOWN STRL REIN XL XLG (GOWN DISPOSABLE) ×4 IMPLANT
HEMOSTAT SNOW SURGICEL 2X4 (HEMOSTASIS) ×4 IMPLANT
HEMOSTAT SURGICEL 4X8 (HEMOSTASIS) IMPLANT
IV CATH 14GX2 1/4 (CATHETERS) ×2 IMPLANT
KIT BASIN OR (CUSTOM PROCEDURE TRAY) ×2 IMPLANT
NS IRRIG 1000ML POUR BTL (IV SOLUTION) IMPLANT
POUCH SPECIMEN RETRIEVAL 10MM (ENDOMECHANICALS) ×2 IMPLANT
SET CHOLANGIOGRAPH MIX (MISCELLANEOUS) ×2 IMPLANT
SET IRRIG TUBING LAPAROSCOPIC (IRRIGATION / IRRIGATOR) ×2 IMPLANT
SOLUTION ANTI FOG 6CC (MISCELLANEOUS) ×2 IMPLANT
STRIP CLOSURE SKIN 1/2X4 (GAUZE/BANDAGES/DRESSINGS) ×2 IMPLANT
SUT MNCRL AB 4-0 PS2 18 (SUTURE) ×2 IMPLANT
TOWEL OR 17X26 10 PK STRL BLUE (TOWEL DISPOSABLE) ×4 IMPLANT
TRAY LAP CHOLE (CUSTOM PROCEDURE TRAY) ×2 IMPLANT
TROCAR BLADELESS OPT 5 75 (ENDOMECHANICALS) ×6 IMPLANT
TROCAR XCEL BLUNT TIP 100MML (ENDOMECHANICALS) ×2 IMPLANT
TROCAR XCEL NON-BLD 11X100MML (ENDOMECHANICALS) IMPLANT
TUBING INSUFFLATION 10FT LAP (TUBING) ×2 IMPLANT

## 2012-03-08 NOTE — Anesthesia Preprocedure Evaluation (Signed)

## 2012-03-08 NOTE — Transfer of Care (Signed)
Immediate Anesthesia Transfer of Care Note  Patient: Yvonne Morris  Procedure(s) Performed: Procedure(s) (LRB): LAPAROSCOPIC CHOLECYSTECTOMY WITH INTRAOPERATIVE CHOLANGIOGRAM (N/A)  Patient Location: PACU  Anesthesia Type: General  Level of Consciousness: awake, sedated and patient cooperative  Airway & Oxygen Therapy: Patient Spontanous Breathing and Patient connected to face mask oxygen  Post-op Assessment: Report given to PACU RN and Post -op Vital signs reviewed and stable  Post vital signs: Reviewed and stable  Complications: No apparent anesthesia complications

## 2012-03-08 NOTE — Discharge Instructions (Addendum)
[facility] ______[facility] LAPAROSCOPIC SURGERY: POST OP INSTRUCTIONS Always review your discharge instruction sheet given to you by the facility where your surgery was performed. IF YOU HAVE DISABILITY OR FAMILY LEAVE FORMS, YOU MUST BRING THEM TO THE OFFICE FOR PROCESSING.   DO NOT GIVE THEM TO YOUR DOCTOR.  1. A prescription for pain medication may be given to you upon discharge.  Take your pain medication as prescribed, if needed.  If narcotic pain medicine is not needed, then you may take acetaminophen (Tylenol) or ibuprofen (Advil) as needed. 2. Take your usually prescribed medications unless otherwise directed. 3. If you need a refill on your pain medication, please contact your pharmacy.  They will contact our office to request authorization. Prescriptions will not be filled after 5pm or on week-ends. 4. You should follow a light diet the first few days after arrival home, such as soup and crackers, etc.  Be sure to include lots of fluids daily. 5. Most patients will experience some swelling and bruising in the area of the incisions.  Ice packs will help.  Swelling and bruising can take several days to resolve.  6. It is common to experience some constipation if taking pain medication after surgery.  Increasing fluid intake and taking a stool softener (such as Colace) will usually help or prevent this problem from occurring.  A mild laxative (Milk of Magnesia or Miralax) should be taken according to package instructions if there are no bowel movements after 48 hours. 7. Unless discharge instructions indicate otherwise, you may remove your bandages 72 hours after surgery, and you may shower at that time.  You may have steri-strips (small skin tapes) in place directly over the incision.  These strips should be left on the skin.  If your surgeon used skin glue on the incision, you may shower in 24 hours.  The glue will flake off over the next 2-3 weeks.  Any sutures or staples will be  removed at the office during your follow-up visit. 8. ACTIVITIES:  You may resume regular (light) daily activities beginning the next day--such as daily self-care, walking, climbing stairs--gradually increasing activities as tolerated.  You may have sexual intercourse when it is comfortable.  Refrain from any heavy lifting or straining-nothing over 10 pounds for 2 weeks. a. You may drive when you are no longer taking prescription pain medication, you can comfortably wear a seatbelt, and you can safely maneuver your car and apply brakes. b. RETURN TO WORK:  _Desk work only in one week.  Full duty in 2 weeks._________________________________________________________ 9. You should see your doctor in the office for a follow-up appointment approximately 2-3 weeks after your surgery.  Make sure that you call for this appointment within a day or two after you arrive home to insure a convenient appointment time. 10. OTHER INSTRUCTIONS: ____have your Potassium level checked by Dr. Felipa Eth next week.______________________________________________________________________________________________________________________ __________________________________________________________________________________________________________________________ WHEN TO CALL YOUR DOCTOR: 1. Fever over 101.5 2. Inability to urinate 3. Continued bleeding from incision. 4. Increased pain, redness, or drainage from the incision. 5. Increasing abdominal pain  The clinic staff is available to answer your questions during regular business hours.  Please don't hesitate to call and ask to speak to one of the nurses for clinical concerns.  If you have a medical emergency, go to the nearest emergency room or call 911.  A surgeon from [facility] is always on call at the hospital. [address], [address], [place], [place]  [address] ?  [id number], [place], [place]   [address] [phone] ? [phone] ? FAX [phone] Web  site: [email or web address]

## 2012-03-08 NOTE — Progress Notes (Signed)
Day of Surgery  Subjective: Still with RUQ pain.  Objective: Vital signs in last 24 hours: Temp:  [98 F (36.7 C)-99.8 F (37.7 C)] 99 F (37.2 C) (05/15 0814) Pulse Rate:  [94-102] 100  (05/15 0814) Resp:  [16-18] 18  (05/15 0814) BP: (120-149)/(78-87) 124/81 mmHg (05/15 0814) SpO2:  [90 %-96 %] 92 % (05/15 0539) Weight:  [192 lb (87.091 kg)] 192 lb (87.091 kg) (05/14 1734) Last BM Date: 03/07/12  Intake/Output from previous day: 05/14 0701 - 05/15 0700 In: 1244 [I.V.:1244] Out: 200 [Urine:200] Intake/Output this shift: Total I/O In: 0  Out: 200 [Urine:200]  PE: Abd-RUQ tenderness unchanged  Lab Results:   Basename 03/08/12 0355  WBC 18.7*  HGB 11.2*  HCT 35.0*  PLT 255   BMET  Basename 03/08/12 0355  NA 139  K 2.7*  CL 101  CO2 28  GLUCOSE 137*  BUN 10  CREATININE 0.63  CALCIUM 7.2*   PT/INR No results found for this basename: LABPROT:2,INR:2 in the last 72 hours Comprehensive Metabolic Panel:    Component Value Date/Time   NA 139 03/08/2012 0355   K 2.7* 03/08/2012 0355   CL 101 03/08/2012 0355   CO2 28 03/08/2012 0355   BUN 10 03/08/2012 0355   CREATININE 0.63 03/08/2012 0355   GLUCOSE 137* 03/08/2012 0355   CALCIUM 7.2* 03/08/2012 0355   AST 13 03/08/2012 0355   ALT 13 03/08/2012 0355   ALKPHOS 82 03/08/2012 0355   BILITOT 0.3 03/08/2012 0355   PROT 6.9 03/08/2012 0355   ALBUMIN 2.8* 03/08/2012 0355     Studies/Results: Dg Chest 2 View  03/08/2012  *RADIOLOGY REPORT*  Clinical Data: Upper abdominal pain.  Preop cholecystectomy.  CHEST - 2 VIEW  Comparison: None.  Findings: Low lung volumes.  Normal heart size.  No infiltrates or failure.  No effusion or pneumothorax.  Bones unremarkable.  IMPRESSION: Low lung volumes.  No active cardiopulmonary disease.  Original Report Authenticated By: Elsie Stain, M.D.   US Abdomen Limited Ruq  03/07/2012  *RADIOLOGY REPORT*  Clinical Data:  Right upper quadrant pain.  Nausea and vomiting. History of thyroid  carcinoma.  LIMITED ABDOMINAL ULTRASOUND - RIGHT UPPER QUADRANT  Comparison:  None.  Findings:  Gallbladder:  Cholelithiasis is present.  Largest gallstone measures 22 mm.  No wall thickening.  Positive sonographic Murphy's sign is present.  These two findings together have a high positive predicted value for early acute cholecystitis even in the absence of wall thickening.  Common bile duct:  8.5 mm.  No common duct stone is identified. The common duct is dilated for age.  If the gallbladder is removed, cholangiography is recommended.  Liver:  19 mm Cystic lesion is present in the left hepatic lobe (image 82).  This is indeterminant on ultrasound.  There may be some increased through transmission but based on these images, there appears to be some internal echotexture.  Liver MRI in follow- up is recommended in this patient with a history of malignancy.  IMPRESSION:  1.  Cholelithiasis with positive sonographic Murphy's sign.  This has a high positive predicted value for early acute cholecystitis. 2.  19 mm cystic lesion in the left hepatic lobe is indeterminant. Sonographic images demonstrate equivocal increased through transmission and some internal echoes.  Follow up liver MRI  with and without infusion is recommended in this patient with history of thyroid cancer.  These results will be called to the ordering clinician or representative by the Radiologist Assistant, and  communication documented in the PACS Dashboard.                   Original Report Authenticated By: Andreas Newport, M.D.    Anti-infectives: Anti-infectives     Start     Dose/Rate Route Frequency Ordered Stop   03/07/12 2000   Ampicillin-Sulbactam (UNASYN) 3 g in sodium chloride 0.9 % 100 mL IVPB        3 g 100 mL/hr over 60 Minutes Intravenous Every 6 hours 03/07/12 1749            Assessment Active Problems:  Acute cholecystitis  Hypokalemia-being repleted     LOS: 1 day   Plan: Laparoscopic possible open  cholecystectomy today.   Yvonne Morris 03/08/2012

## 2012-03-08 NOTE — Op Note (Signed)
Preoperative diagnosis:  Acute cholecystitis  Postoperative diagnosis:  Same  Procedure: Laparoscopic cholecystectomy with cholangiogram.  Surgeon: Avel Peace, M.D.  Asst.:  None  Anesthesia: Gen.  Indication:   [name] admitted yesterday evening with clinical radiographic and laboratory findings consistent with acute cholecystitis. She is brought to the operating room today for cholecystectomy. The procedure, risks, and after care were discussed with her preoperatively.  Technique: The patient was brought to the operating room, placed supine on the operating table, and a general anesthetic was administered. The abdominal wall was then sterilely prepped and draped. Local anesthetic (Marcaine) was infiltrated in the subumbilical region. A small subumbilical incision was made in a subumbilical transverse scar through the skin, subcutaneous tissue, fascia, and peritoneum entering the peritoneal cavity under direct vision. A pursestring suture of 0 Vicryl was placed around the edges of the fascia. A Hassan trocar was introduced into the peritoneal cavity and a pneumoperitoneum was created by insufflation of carbon dioxide gas. The laparoscope was introduced into the trocar and no underlying bleeding or organ injury was noted. The patient was then placed in the reverse Trendelenburg position with the right side tilted slightly up.  Three 5 mm trocars were then placed into the abdominal cavity under laparoscopic vision. One in the epigastric area, and 2 in the right upper quadrant area.  The omentum was adherent to an acutely inflamed, distended gallbladder. Using blunt dissection, the omentum was swept away from the gallbladder. Needle decompression of the gallbladder was then performed. The fundus was grasped and retracted toward the right shoulder.  The infundibulum was mobilized with dissection close to the gallbladder and retracted laterally. The cystic duct was identified  and a window was created around it. The anterior branch cystic artery was also identified and a window was created around it. It was clipped and divided. The critical view was achieved. A clip was placed at the neck of the gallbladder. A small incision was made in the cystic duct. A cholangiocatheter was introduced through the anterior abdominal wall and placed in the cystic duct. A intraoperative cholangiogram was then performed.  Under real-time fluoroscopy, dilute contrast was injected into the cystic duct.  The common hepatic duct, the right and left hepatic ducts, and the common duct were all visualized. Contrast drained into the duodenum without obvious evidence of any obstructing ductal lesion. The final report is pending the Radiologist's interpretation.  The cholangiocatheter was removed, the cystic duct was clipped 3 times on the biliary side, and then the cystic duct was divided sharply. No bile leak was noted from the cystic duct stump.  A posterior branch of the cystic artery was identified and a window created around it. It was then clipped and divided. Following this the gallbladder was dissected free from the liver using electrocautery. The gallbladder was then placed in a retrieval bag and removed from the abdominal cavity through the subumbilical incision.  The gallbladder fossa was inspected, irrigated, and bleeding was controlled with electrocautery.  Two pieces of Surgicel were placed in the gallbladder fossa. Inspection of the abdominal cavity showed that hemostasis was adequate and there was no evidence of bile leak or organ injury.  The irrigation fluid was evacuated as much as possible.  The subumbilical trocar was removed and the fascial defect was closed by tightening and tying down the pursestring suture under laparoscopic vision.  The remaining trochars were removed and the pneumoperitoneum was released. The skin incisions were closed with 4-0 Monocryl subcuticular  stitches.  Steri-Strips and sterile dressings were applied.  The procedure was well-tolerated without any apparent complications. The patient was taken to the recovery room in satisfactory condition.

## 2012-03-08 NOTE — Anesthesia Postprocedure Evaluation (Signed)
  Anesthesia Post-op Note  Patient: Yvonne Morris  Procedure(s) Performed: Procedure(s) (LRB): LAPAROSCOPIC CHOLECYSTECTOMY WITH INTRAOPERATIVE CHOLANGIOGRAM (N/A)  Patient Location: PACU  Anesthesia Type: General  Level of Consciousness: awake and alert   Airway and Oxygen Therapy: Patient Spontanous Breathing  Post-op Pain: mild  Post-op Assessment: Post-op Vital signs reviewed, Patient's Cardiovascular Status Stable, Respiratory Function Stable, Patent Airway and No signs of Nausea or vomiting  Post-op Vital Signs: stable  Complications: No apparent anesthesia complications

## 2012-03-09 LAB — BASIC METABOLIC PANEL
BUN: 5 mg/dL — ABNORMAL LOW (ref 6–23)
Chloride: 100 mEq/L (ref 96–112)
GFR calc Af Amer: >90 mL/min (ref >90)
Potassium: 3 mEq/L — ABNORMAL LOW (ref 3.5–5.1)
Sodium: 137 mEq/L (ref 135–145)

## 2012-03-09 MED ORDER — PANTOPRAZOLE SODIUM 40 MG PO TBEC
40.0000 mg | DELAYED_RELEASE_TABLET | Freq: Every day | ORAL | Status: DC
  Administered 2012-03-09 – 2012-03-10 (×2): 40 mg via ORAL
  Filled 2012-03-09: qty 1

## 2012-03-09 NOTE — Progress Notes (Signed)
The patient is receiving Protonix by the intravenous route.  Based on criteria approved by the Pharmacy and Therapeutics Committee and the Medical Executive Committee, the medication is being converted to the equivalent oral dose form.  These criteria include: -No Active GI bleeding -Able to tolerate diet of full liquids (or better) or tube feeding -Able to tolerate other medications by the oral or enteral route  If you have any questions about this conversion, please contact the Pharmacy Department (ext [phone]).  Thank you.  Otho Bellows, PHARMD 03/09/2012 2:34 PM

## 2012-03-09 NOTE — Discharge Summary (Signed)
Physician Discharge Summary  Patient ID: Yvonne Morris MRN: [id number] DOB/AGE: 55-Jan-1957 55 y.o.  Admit date: 03/07/2012 Discharge date: 03/09/2012  Admission Diagnoses: Acute cholecystitis .  Thyroid cancer    Hypertension  Depression    Discharge Diagnoses: same Active Problems:  Acute cholecystitis   PROCEDURES: Laparoscopic cholecystectomy with cholangiogram. 03/08/2012 Avel Peace, M.D.       Hospital Course: [name] who had the onset of epigastric pain on May 12 at 4 PM. [place] the beach. The pain persisted and she had nausea and dry heaves for the next 24 hours. She presented to Dr. Vicente Males office for evaluation. She was noted to have a leukocytosis and right upper quadrant tenderness and a Murphy's sign. She underwent ultrasound demonstrating multiple gallstones and a slightly dilated common bile duct. There was a positive Murphy sign during the ultrasound as well. Her liver function tests were normal. She is now admitted with a diagnosis of acute cholecystitis. She underwent surgery and was sore the first post op day, K+ was low and replaced. She has progressed nicely and we plan to send home today 03/10/12. She was sent home on 5 more days of antibiotic.  Follow up: Dr. Abbey Chatters 2 weeks  Dr. Felipa Eth, 2 weeks Condition on D/C:  Improved  Disposition:  home   Medication List  As of 03/09/2012  3:21 PM   ASK your doctor about these medications         cholecalciferol 1000 UNITS tablet   Commonly known as: VITAMIN D   Take 1,000 Units by mouth daily.      estradiol 2 MG tablet   Commonly known as: ESTRACE   Take 2 mg by mouth daily.      levothyroxine 175 MCG tablet   Commonly known as: SYNTHROID, LEVOTHROID   Take 175 mcg by mouth daily.      lisinopril-hydrochlorothiazide 20-12.5 MG per tablet   Commonly known as: PRINZIDE,ZESTORETIC   Take 1 tablet by mouth daily.      PARoxetine 20 MG tablet   Commonly known  as: PAXIL   Take 20 mg by mouth daily.             SignedSherrie George 03/09/2012, 3:21 PM

## 2012-03-09 NOTE — Progress Notes (Signed)
1 Day Post-Op  Subjective: Sore around incisions.  Objective: Vital signs in last 24 hours: Temp:  [98.1 F (36.7 C)-99 F (37.2 C)] 98.1 F (36.7 C) (05/16 0642) Pulse Rate:  [81-103] 81  (05/16 0642) Resp:  [10-18] 18  (05/16 0642) BP: (114-150)/(71-84) 114/78 mmHg (05/16 0642) SpO2:  [93 %-100 %] 93 % (05/16 0642) Last BM Date: 03/07/12  Intake/Output from previous day: 05/15 0701 - 05/16 0700 In: 2050 [I.V.:1950; IV Piggyback:100] Out: 1300 [Urine:1300] Intake/Output this shift:    PE: Abd-soft, dressing dry  Lab Results:   [facility] 03/08/12 0355  WBC 18.7*  HGB 11.2*  HCT 35.0*  PLT 255   BMET  Basename 03/09/12 0352 03/08/12 0355  NA 137 139  K 3.0* 2.7*  CL 100 101  CO2 30 28  GLUCOSE 130* 137*  BUN 5* 10  CREATININE 0.57 0.63  CALCIUM 7.0* 7.2*   PT/INR No results found for this basename: LABPROT:2,INR:2 in the last 72 hours Comprehensive Metabolic Panel:    Component Value Date/Time   NA 137 03/09/2012 0352   K 3.0* 03/09/2012 0352   CL 100 03/09/2012 0352   CO2 30 03/09/2012 0352   BUN 5* 03/09/2012 0352   CREATININE 0.57 03/09/2012 0352   GLUCOSE 130* 03/09/2012 0352   CALCIUM 7.0* 03/09/2012 0352   AST 13 03/08/2012 0355   ALT 13 03/08/2012 0355   ALKPHOS 82 03/08/2012 0355   BILITOT 0.3 03/08/2012 0355   PROT 6.9 03/08/2012 0355   ALBUMIN 2.8* 03/08/2012 0355     Studies/Results: Dg Chest 2 View  03/08/2012  *RADIOLOGY REPORT*  Clinical Data: Upper abdominal pain.  Preop cholecystectomy.  CHEST - 2 VIEW  Comparison: None.  Findings: Low lung volumes.  Normal heart size.  No infiltrates or failure.  No effusion or pneumothorax.  Bones unremarkable.  IMPRESSION: Low lung volumes.  No active cardiopulmonary disease.  Original Report Authenticated By: Elsie Stain, M.D.   Dg Cholangiogram Operative  03/08/2012  *RADIOLOGY REPORT*  Clinical Data:   Cholecystitis.  INTRAOPERATIVE CHOLANGIOGRAM  Technique:  Cholangiographic images from the C-arm  fluoroscopic device were submitted for interpretation post-operatively.  Please see the procedural report for the amount of contrast and the fluoroscopy time utilized.  Comparison:  Ultrasound of the abdomen 03/07/2012  Findings:  The gallbladder has been removed and the cystic duct cannulated.  Contrast injection shows good opacification of the biliary tree without filling defects or obstruction.  There is prompt passage of contrast into the duodenum.  IMPRESSION: Negative operative cholangiogram.  Original Report Authenticated By: Elsie Stain, M.D.   US Abdomen Limited Ruq  03/07/2012  *RADIOLOGY REPORT*  Clinical Data:  Right upper quadrant pain.  Nausea and vomiting. History of thyroid carcinoma.  LIMITED ABDOMINAL ULTRASOUND - RIGHT UPPER QUADRANT  Comparison:  None.  Findings:  Gallbladder:  Cholelithiasis is present.  Largest gallstone measures 22 mm.  No wall thickening.  Positive sonographic Murphy's sign is present.  These two findings together have a high positive predicted value for early acute cholecystitis even in the absence of wall thickening.  Common bile duct:  8.5 mm.  No common duct stone is identified. The common duct is dilated for age.  If the gallbladder is removed, cholangiography is recommended.  Liver:  19 mm Cystic lesion is present in the left hepatic lobe (image 82).  This is indeterminant on ultrasound.  There may be some increased through transmission but based on these images, there appears to be  some internal echotexture.  Liver MRI in follow- up is recommended in this patient with a history of malignancy.  IMPRESSION:  1.  Cholelithiasis with positive sonographic Murphy's sign.  This has a high positive predicted value for early acute cholecystitis. 2.  19 mm cystic lesion in the left hepatic lobe is indeterminant. Sonographic images demonstrate equivocal increased through transmission and some internal echoes.  Follow up liver MRI  with and without infusion is recommended in  this patient with history of thyroid cancer.  These results will be called to the ordering clinician or representative by the Radiologist Assistant, and communication documented in the PACS Dashboard.                   Original Report Authenticated By: Andreas Newport, M.D.    Anti-infectives: Anti-infectives     Start     Dose/Rate Route Frequency Ordered Stop   03/07/12 2000   Ampicillin-Sulbactam (UNASYN) 3 g in sodium chloride 0.9 % 100 mL IVPB        3 g 100 mL/hr over 60 Minutes Intravenous Every 6 hours 03/07/12 1749            Assessment Active Problems:  Acute cholecystitis s/p lap chole 03/08/3012-stable overnight; very sore  Hypokalemia    LOS: 2 days   Plan: Correct hypokalemia. Decrease IVF.  Mobilize.  Continue IV abxs.   Langdon Crosson J 03/09/2012

## 2012-03-10 ENCOUNTER — Encounter (HOSPITAL_COMMUNITY): Payer: Self-pay | Admitting: General Surgery

## 2012-03-10 MED ORDER — POTASSIUM CHLORIDE ER 10 MEQ PO TBCR: 20.0000 meq | EXTENDED_RELEASE_TABLET | Freq: Two times a day (BID) | ORAL | Status: DC

## 2012-03-10 MED ORDER — AMOXICILLIN-POT CLAVULANATE 875-125 MG PO TABS: 1.0000 | ORAL_TABLET | Freq: Two times a day (BID) | ORAL | Status: AC

## 2012-03-10 MED ORDER — OXYCODONE-ACETAMINOPHEN 5-325 MG PO TABS: 1.0000 | ORAL_TABLET | ORAL | Status: AC | PRN

## 2012-03-10 NOTE — Discharge Summary (Signed)
Physician Discharge Summary  Patient ID: Yvonne Morris MRN: [id number] DOB/AGE: 04-16-1956 56 y.o.  Admit date: 03/07/2012 Discharge date: 03/10/2012  Admission Diagnoses:  Acute cholecystitis  Discharge Diagnoses:  Active Problems:  Acute cholecystitis Hypokalemia  Discharged Condition: good  Hospital Course: She was admitted and started on IV antibiotics.  She underwent a laparoscopic cholecystectomy on 03/08/2012.  She was noted to have hypokalemia preoperatively and was given IV KCl replacement.  She remained on IV antibiotics postoperatively.  She had an uncomplicated postoperative course and was ready for discharge on POD #2.  She will take oral antibiotics for 5 more days.  She will also take oral KCl and have her Potassium checked by Dr. Felipa Eth next week.  She was given Roxicet for pain.  Full discharge instructions were given to her.  Consults: None  Significant Diagnostic Studies: none   Treatments: surgery: Lap Chole with IOC  Discharge Exam: Blood pressure 138/87, pulse 78, temperature 97.9 F (36.6 C), temperature source Oral, resp. rate 17, height 5\' 7"  (1.702 m), weight 192 lb (87.091 kg), SpO2 94.00%.   Disposition:    Medication List  As of 03/10/2012  1:59 PM   TAKE these medications         amoxicillin-clavulanate 875-125 MG per tablet   Commonly known as: AUGMENTIN   Take 1 tablet by mouth 2 (two) times daily.      cholecalciferol 1000 UNITS tablet   Commonly known as: VITAMIN D   Take 1,000 Units by mouth daily.      estradiol 2 MG tablet   Commonly known as: ESTRACE   Take 2 mg by mouth daily.      levothyroxine 175 MCG tablet   Commonly known as: SYNTHROID, LEVOTHROID   Take 175 mcg by mouth daily.      lisinopril-hydrochlorothiazide 20-12.5 MG per tablet   Commonly known as: PRINZIDE,ZESTORETIC   Take 1 tablet by mouth daily.      oxyCODONE-acetaminophen 5-325 MG per tablet   Commonly known as: PERCOCET   Take 1-2 tablets by mouth every 4  (four) hours as needed.      PARoxetine 20 MG tablet   Commonly known as: PAXIL   Take 20 mg by mouth daily.      potassium chloride 10 MEQ tablet   Commonly known as: K-DUR   Take 2 tablets (20 mEq total) by mouth 2 (two) times daily.           Follow-up Information    Follow up with Fletcher Rathbun J, MD. Schedule an appointment as soon as possible for a visit in 2 weeks.   Contact information:   [facility] [address] [place] [address] [phone]          Signed: Adolph Pollack 03/10/2012, 1:59 PM

## 2012-03-10 NOTE — Progress Notes (Signed)
2 Days Post-Op  Subjective: Still sore but feeling better overall.  Wants to go home.  Objective: Vital signs in last 24 hours: Temp:  [97.9 F (36.6 C)-98.7 F (37.1 C)] 97.9 F (36.6 C) (05/17 0700) Pulse Rate:  [78-87] 78  (05/17 0700) Resp:  [17-18] 17  (05/17 0700) BP: (120-142)/(65-87) 138/87 mmHg (05/17 0700) SpO2:  [92 %-95 %] 94 % (05/17 0700) Last BM Date: 03/07/12  Intake/Output from previous day: 05/16 0701 - 05/17 0700 In: 2145 [P.O.:320; I.V.:1425; IV Piggyback:400] Out: 450 [Urine:450] Intake/Output this shift: Total I/O In: 120 [P.O.:120] Out: 400 [Urine:400]  PE: Abd-soft, dressings dry  Lab Results:   Basename 03/08/12 0355  WBC 18.7*  HGB 11.2*  HCT 35.0*  PLT 255   BMET  Basename 03/09/12 0352 03/08/12 0355  NA 137 139  K 3.0* 2.7*  CL 100 101  CO2 30 28  GLUCOSE 130* 137*  BUN 5* 10  CREATININE 0.57 0.63  CALCIUM 7.0* 7.2*   PT/INR No results found for this basename: LABPROT:2,INR:2 in the last 72 hours Comprehensive Metabolic Panel:    Component Value Date/Time   NA 137 03/09/2012 0352   K 3.0* 03/09/2012 0352   CL 100 03/09/2012 0352   CO2 30 03/09/2012 0352   BUN 5* 03/09/2012 0352   CREATININE 0.57 03/09/2012 0352   GLUCOSE 130* 03/09/2012 0352   CALCIUM 7.0* 03/09/2012 0352   AST 13 03/08/2012 0355   ALT 13 03/08/2012 0355   ALKPHOS 82 03/08/2012 0355   BILITOT 0.3 03/08/2012 0355   PROT 6.9 03/08/2012 0355   ALBUMIN 2.8* 03/08/2012 0355     Studies/Results: No results found.  Anti-infectives: Anti-infectives     Start     Dose/Rate Route Frequency Ordered Stop   03/07/12 2000   Ampicillin-Sulbactam (UNASYN) 3 g in sodium chloride 0.9 % 100 mL IVPB        3 g 100 mL/hr over 60 Minutes Intravenous Every 6 hours 03/07/12 1749            Assessment Active Problems:  Acute cholecystitis- s/p lap chole; improving  Hypokalemia-on replacement per IVF    LOS: 3 days   Plan: Discharge on oral abxs and oral potassium.   Instructions given.   Amrita Radu J 03/10/2012

## 2012-03-16 NOTE — Discharge Summary (Signed)
Agree with discharge summary.

## 2012-04-10 ENCOUNTER — Ambulatory Visit (INDEPENDENT_AMBULATORY_CARE_PROVIDER_SITE_OTHER): Payer: BC Managed Care – PPO | Admitting: General Surgery

## 2012-04-10 ENCOUNTER — Encounter (INDEPENDENT_AMBULATORY_CARE_PROVIDER_SITE_OTHER): Payer: Self-pay | Admitting: General Surgery

## 2012-04-10 VITALS — BP 148/92 | HR 72 | Temp 97.3°F | Resp 14 | Ht 67.0 in | Wt 191.0 lb

## 2012-04-10 DIAGNOSIS — Z9889 Other specified postprocedural states: Secondary | ICD-10-CM

## 2012-04-10 MED ORDER — DOXYCYCLINE HYCLATE 100 MG PO TABS: 100.0000 mg | ORAL_TABLET | Freq: Two times a day (BID) | ORAL | Status: AC

## 2012-04-10 NOTE — Progress Notes (Signed)
Operation:l   Laparoscopic cholecystectomy with cholangiogram 4 acute cholecystitis  Date:  Mar 08, 2012  Pathology:  Acute gangrenous cholecystitis  HPI:she is here for her first postoperative visit. She is tolerating a low-fat diet. No postprandial diarrhea. She's had slight redness around her epigastric and medial right upper quadrant incision. No drainage.   Physical Exam:  Gen.-she looks well and is in no acute distress.  Abdomen-the subumbilical and right upper quadrant lateral incisions are clean and intact. There is some focal hyperemia and a scab present in both the epigastric and medial right upper quadrant incision.   Assessment:  She has a reaction or superficial infection around two of the incisions.  Otherwise is doing well.  Plan:  Clean 2 areas of incisions with warm soapy water in the shower daily.  Doxycycline for 10 days. Return visit 3 weeks. Activities as tolerated. Continue low fat diet.

## 2012-04-10 NOTE — Patient Instructions (Signed)
Clean two areas with warm soapy water in the shower daily.

## 2012-04-17 ENCOUNTER — Encounter (INDEPENDENT_AMBULATORY_CARE_PROVIDER_SITE_OTHER): Payer: BC Managed Care – PPO | Admitting: Surgery

## 2012-04-24 ENCOUNTER — Encounter (INDEPENDENT_AMBULATORY_CARE_PROVIDER_SITE_OTHER): Payer: Self-pay

## 2012-05-08 ENCOUNTER — Ambulatory Visit (INDEPENDENT_AMBULATORY_CARE_PROVIDER_SITE_OTHER): Payer: BC Managed Care – PPO | Admitting: General Surgery

## 2012-05-08 ENCOUNTER — Encounter (INDEPENDENT_AMBULATORY_CARE_PROVIDER_SITE_OTHER): Payer: Self-pay | Admitting: General Surgery

## 2012-05-08 VITALS — BP 140/86 | HR 76 | Temp 97.5°F | Resp 16 | Ht 67.0 in | Wt 194.2 lb

## 2012-05-08 DIAGNOSIS — Z9889 Other specified postprocedural states: Secondary | ICD-10-CM

## 2012-05-08 NOTE — Progress Notes (Signed)
Operation:l   Laparoscopic cholecystectomy with cholangiogram 4 acute cholecystitis  Date:  Mar 08, 2012  Pathology:  Acute gangrenous cholecystitis  HPI:  She is here for her second postoperative visit.   She had superficial wound infections of her epigastric and medial right upper quadrant incision and she was placed on Doxycycline for this.  They are better.   Physical Exam:  Gen.-she looks well and is in no acute distress.  Abdomen-  All incisions are clean and intact without signs of infection.   Assessment:  Superficial infection around two of the incisions that has resolved with antibiotic therapy.  Plan:   RTC prn.

## 2012-05-08 NOTE — Patient Instructions (Signed)
May use Mederma on scars. 

## 2012-06-22 ENCOUNTER — Ambulatory Visit (INDEPENDENT_AMBULATORY_CARE_PROVIDER_SITE_OTHER): Payer: BC Managed Care – PPO | Admitting: Family Medicine

## 2012-06-22 VITALS — BP 160/94 | HR 81 | Temp 97.9°F | Resp 16 | Ht 66.5 in | Wt 198.0 lb

## 2012-06-22 DIAGNOSIS — L03213 Periorbital cellulitis: Secondary | ICD-10-CM

## 2012-06-22 DIAGNOSIS — H00039 Abscess of eyelid unspecified eye, unspecified eyelid: Secondary | ICD-10-CM

## 2012-06-22 MED ORDER — CLINDAMYCIN HCL 300 MG PO CAPS: 300.0000 mg | ORAL_CAPSULE | Freq: Three times a day (TID) | ORAL | Status: AC

## 2012-06-22 NOTE — Progress Notes (Signed)
Patient ID: Yvonne Morris, female   DOB: 1956-09-25, 56 y.o.   MRN: [id number] Yvonne Morris is a 56 y.o. female who presents to Urgent Care today for evaluation of Right eye:  1.  Swelling around eye:  Looked in mirror and noted red swelling around Right eye.  Occurred about mid-day yesterday.  Began becoming painful last night.  DId not sleep very well last night due to swelling and pain.  Used ice on it and Benadryl without any relief.  Denies any blurriness or change in vision.  NO ocular pain.  Does not remember any trauma to area.    No nausea or vomiting.  No fevers or chills.  Went to [facility], told it was warm and needed to come somewhere else to have it treated.     PMH reviewed.  ROS as above otherwise neg.  No chest pain, palpitations, SOB, Fever, Chills, Abd pain, N/V/D.  Medications reviewed. Current Outpatient Prescriptions  Medication Sig Dispense Refill  . estradiol (ESTRACE) 2 MG tablet Take 2 mg by mouth daily.       Marland Kitchen levothyroxine (SYNTHROID, LEVOTHROID) 175 MCG tablet Take 175 mcg by mouth daily.       Marland Kitchen PARoxetine (PAXIL) 20 MG tablet Take 20 mg by mouth daily.       Marland Kitchen UNKNOWN TO PATIENT Blood Pressure medication. Just started 2 weeks ago but pt doesn't remember.        Exam:  BP 160/94  Pulse 81  Temp 97.9 F (36.6 C)  Resp 16  Ht 5' 6.5" (1.689 m)  Wt 198 lb (89.812 kg)  BMI 31.48 kg/m2 Gen: Well NAD Gen:  Alert, cooperative patient who appears stated age in no acute distress.  Vital signs reviewed. Head:  /AT Eyes:  EOMI, PERRL BL.  Sclera and conjunctiva non-erythematous and non-icteric BL.  Left eye WNL.  Right eye with periorbital edema and erythema upper eyelid, entire eyelid.  She does have a small wound about 2 mm in size on lateral aspect of upper eye.  This appears to have scabbed over.  1.5 cm if induration surrounds this wound without any fluctuance noted.  No pain with extraocular movement.   Nose:  Nares patent Ears:  External ears without  erythema or lesions noted Mouth:  MMM.  Tonsils +2 BL without erythema or edema noted Neck:  No lymphadenopathy noted Lungs: CTABL Nl WOB Heart: RRR no MRG   Assessment and Plan:  1.  Preseptal cellulitis:  Does not appear to affect orbit, no extraocular movement pain, no eye pain.  Vision is at baseline and equal in both eyes (20/40) and unchanged.  No fluctuance currently, nothing to drain.  Clinda 300 mg TID to cover for both community acquired MRSA and strep.  I want her to return tomorrow after applying heat to her eyelid to see if this has caused any fluctuance to appear that might need to be drained.  Also want to have it rechecked before heading into the weekend, she states she is going out of town this weekend.

## 2012-06-22 NOTE — Patient Instructions (Addendum)
Come back and see [place] tomorrow so we can make sure that your eye is getting better.   Take the Clindamycin 3 pills a day (breakfast, lunch, and dinner) with food.    It was good to meet you!

## 2012-06-23 ENCOUNTER — Ambulatory Visit (INDEPENDENT_AMBULATORY_CARE_PROVIDER_SITE_OTHER): Payer: BC Managed Care – PPO | Admitting: Emergency Medicine

## 2012-06-23 VITALS — BP 144/90 | HR 88 | Temp 97.9°F | Resp 16 | Ht 66.0 in | Wt 198.0 lb

## 2012-06-23 DIAGNOSIS — H05019 Cellulitis of unspecified orbit: Secondary | ICD-10-CM

## 2012-06-23 NOTE — Progress Notes (Signed)
   Date:  06/23/2012   Name:  Yvonne Morris   DOB:  1956/04/08   MRN:  [id number] Gender: female Age: 56 y.o.  PCP:  Hoyle Sauer, MD    Chief Complaint: Follow-up   History of Present Illness:  Yvonne Morris is a 56 y.o. pleasant patient who presents with the following:  Seen last night for orbital cellulitis.  Interval history is significant for improvement.  No pain.  Less tenderness and swelling.  No conjunctivitis.  No fever or chills.  No further drainage.  No visual symptoms.  Patient Active Problem List  Diagnosis  . Hurthle cell neoplasm of thyroid, minimally invasive    Past Medical History  Diagnosis Date  . Thyroid cancer     Past Surgical History  Procedure Date  . Abdominal hysterectomy 1999  . Total thyroidectomy 2011, 2012    x2  . Cholecystectomy 03/08/2012    Procedure: LAPAROSCOPIC CHOLECYSTECTOMY WITH INTRAOPERATIVE CHOLANGIOGRAM;  Surgeon: Adolph Pollack, MD;  Location: [facility];  Service: General;  Laterality: N/A;    History  Substance Use Topics  . Smoking status: Former Smoker -- 20 years    Quit date: 03/07/1985  . Smokeless tobacco: Never Used  . Alcohol Use: Yes     1 glass of wine every couple days per pt    Family History  Problem Relation Age of Onset  . Stroke Father     No Known Allergies  Medication list has been reviewed and updated.  Current Outpatient Prescriptions on File Prior to Visit  Medication Sig Dispense Refill  . clindamycin (CLEOCIN) 300 MG capsule Take 1 capsule (300 mg total) by mouth 3 (three) times daily. X 10 days  30 capsule  0  . estradiol (ESTRACE) 2 MG tablet Take 2 mg by mouth daily.       Marland Kitchen levothyroxine (SYNTHROID, LEVOTHROID) 175 MCG tablet Take 175 mcg by mouth daily.       Marland Kitchen PARoxetine (PAXIL) 20 MG tablet Take 20 mg by mouth daily.       Marland Kitchen UNKNOWN TO PATIENT Blood Pressure medication. Just started 2 weeks ago but pt doesn't remember.        Review of Systems:  As per HPI, otherwise  negative.    Physical Examination: Filed Vitals:   06/23/12 1143  BP: 144/90  Pulse: 88  Temp: 97.9 F (36.6 C)  Resp: 16   Filed Vitals:   06/23/12 1143  Height: 5\' 6"  (1.676 m)  Weight: 198 lb (89.812 kg)   Body mass index is 31.96 kg/(m^2). Ideal Body Weight: Weight in (lb) to have BMI = 25: 154.6    GEN: WDWN, NAD, Non-toxic, Alert & Oriented x 3 HEENT: Atraumatic, Normocephalic.  Ears and Nose: No external deformity. EYE:  Right eye no injection or drainage, no hypopion.  Left eye normal ORBIT:  Right eye erythema and little tenderness right periorbital area.  Abrasion lateral right upper orbit at extreme of eyebrow.  No drainage.   EXTR: No clubbing/cyanosis/edema NEURO: Normal gait.  PSYCH: Normally interactive. Conversant. Not depressed or anxious appearing.  Calm demeanor.    Assessment and Plan: Improving cellulitis Return if fever, neuro or visual symptoms, increased pain and as needed Continue antibiotics.  Carmelina Dane, MD

## 2012-09-14 ENCOUNTER — Telehealth (INDEPENDENT_AMBULATORY_CARE_PROVIDER_SITE_OTHER): Payer: Self-pay

## 2012-09-14 ENCOUNTER — Ambulatory Visit (INDEPENDENT_AMBULATORY_CARE_PROVIDER_SITE_OTHER): Payer: BC Managed Care – PPO | Admitting: Surgery

## 2012-09-14 ENCOUNTER — Other Ambulatory Visit (INDEPENDENT_AMBULATORY_CARE_PROVIDER_SITE_OTHER): Payer: Self-pay

## 2012-09-14 NOTE — Telephone Encounter (Signed)
Per Pt's request I reviewed f/u with Dr Gerrit Friends. He states pt should have total body scan done by her endocrine MD that did her ablation after surgery. Once Dr Evlyn Kanner or Dr Felipa Eth do set up the scan pt is to call for f/u appt with Dr Gerrit Friends. I left this msg on pts phone per her request.

## 2012-09-18 ENCOUNTER — Telehealth (INDEPENDENT_AMBULATORY_CARE_PROVIDER_SITE_OTHER): Payer: Self-pay

## 2012-09-18 NOTE — Telephone Encounter (Signed)
Pt returned call and advised of need to have total body scan done by Dr Evlyn Kanner or Dr Felipa Eth and then f/u with Dr Gerrit Friends. Pt states she will call her pcp now.

## 2012-09-28 ENCOUNTER — Other Ambulatory Visit (HOSPITAL_COMMUNITY): Payer: Self-pay | Admitting: Internal Medicine

## 2012-09-28 DIAGNOSIS — C73 Malignant neoplasm of thyroid gland: Secondary | ICD-10-CM

## 2012-10-02 ENCOUNTER — Encounter (HOSPITAL_COMMUNITY)
Admission: RE | Admit: 2012-10-02 | Discharge: 2012-10-02 | Disposition: A | Discharge status: Home / Self Care | Payer: BC Managed Care – PPO | Source: Ambulatory Visit | Attending: Internal Medicine | Admitting: Internal Medicine

## 2012-10-02 DIAGNOSIS — C73 Malignant neoplasm of thyroid gland: Secondary | ICD-10-CM | POA: Insufficient documentation

## 2012-10-02 MED ORDER — THYROTROPIN ALFA 1.1 MG IM SOLR
0.9000 mg | INTRAMUSCULAR | Status: AC
  Administered 2012-10-02: 0.9 mg via INTRAMUSCULAR
  Filled 2012-10-02: qty 0.9

## 2012-10-03 ENCOUNTER — Encounter (HOSPITAL_COMMUNITY)
Admission: RE | Admit: 2012-10-03 | Discharge: 2012-10-03 | Disposition: A | Discharge status: Home / Self Care | Payer: BC Managed Care – PPO | Source: Ambulatory Visit | Attending: Internal Medicine | Admitting: Internal Medicine

## 2012-10-03 MED ORDER — THYROTROPIN ALFA 1.1 MG IM SOLR
0.9000 mg | INTRAMUSCULAR | Status: AC
  Administered 2012-10-03: 0.9 mg via INTRAMUSCULAR
  Filled 2012-10-03: qty 0.9

## 2012-10-04 ENCOUNTER — Encounter (HOSPITAL_COMMUNITY)
Admission: RE | Admit: 2012-10-04 | Discharge: 2012-10-04 | Disposition: A | Discharge status: Home / Self Care | Payer: BC Managed Care – PPO | Source: Ambulatory Visit | Attending: Internal Medicine | Admitting: Internal Medicine

## 2012-10-04 MED ORDER — SODIUM IODIDE I 131 CAPSULE
4.4000 | Freq: Once | INTRAVENOUS | Status: AC | PRN
  Administered 2012-10-04: 4.4 via ORAL

## 2012-10-06 ENCOUNTER — Encounter (HOSPITAL_COMMUNITY)
Admission: RE | Admit: 2012-10-06 | Discharge: 2012-10-06 | Disposition: A | Discharge status: Home / Self Care | Payer: BC Managed Care – PPO | Source: Ambulatory Visit | Attending: Internal Medicine | Admitting: Internal Medicine

## 2012-10-06 MED ORDER — SODIUM IODIDE I 131 CAPSULE
4.0000 | Freq: Once | INTRAVENOUS | Status: AC | PRN
  Administered 2012-10-06: 4 via ORAL

## 2012-11-14 ENCOUNTER — Ambulatory Visit (INDEPENDENT_AMBULATORY_CARE_PROVIDER_SITE_OTHER): Payer: 59 | Admitting: Surgery

## 2012-11-14 ENCOUNTER — Encounter (INDEPENDENT_AMBULATORY_CARE_PROVIDER_SITE_OTHER): Payer: Self-pay | Admitting: Surgery

## 2012-11-14 VITALS — BP 134/68 | HR 60 | Temp 97.0°F | Resp 16 | Ht 67.0 in | Wt 206.0 lb

## 2012-11-14 DIAGNOSIS — D34 Benign neoplasm of thyroid gland: Secondary | ICD-10-CM

## 2012-11-14 DIAGNOSIS — D497 Neoplasm of unspecified behavior of endocrine glands and other parts of nervous system: Secondary | ICD-10-CM

## 2012-11-14 NOTE — Progress Notes (Signed)
General Surgery [facility]  Visit Diagnoses: 1. Hurthle cell neoplasm of thyroid, minimally invasive     HISTORY: Patient is a 57 year old white female who underwent total thyroidectomy for minimally invasive Hurthle cell tumor in 2011. She subsequently underwent treatment with radioactive iodine. She is followed closely by her primary physician. She is taking Synthroid 175 mcg daily.  Patient underwent total body iodine scan in December 2013. This showed no evidence of activity in the thyroid bed and no evidence of metastatic disease.  PERTINENT REVIEW OF SYSTEMS: Denies compressive symptoms. Denies forced changes. Denies tremors. Denies palpitations.  EXAM: HEENT: normocephalic; pupils equal and reactive; sclerae clear; dentition good; mucous membranes moist NECK:  Well-healed surgical incision with good cosmetic result; no palpable nodules in the thyroid bed; symmetric on extension; no palpable anterior or posterior cervical lymphadenopathy; no supraclavicular masses; no tenderness CHEST: clear to auscultation bilaterally without rales, rhonchi, or wheezes CARDIAC: regular rate and rhythm without significant murmur; peripheral pulses are full EXT:  non-tender without edema; no deformity NEURO: no gross focal deficits; no sign of tremor   IMPRESSION: Personal history of Hurthle cell neoplasm, minimally invasive, with no evidence of recurrent disease  PLAN: Patient will continue close follow-up with her primary care physician. I am sure he will continue to monitor her TSH level and her thyroglobulin level. At this point she does not require further surgical follow-up and will be released from our practice. I will be happy to see her back at any time.  Velora Heckler, MD, [facility] Office: [phone]

## 2012-11-14 NOTE — Patient Instructions (Addendum)
Thyroglobulin (Tg) This is a test used to monitor treatment of some types of thyroid cancer and to detect recurrence. Less commonly, may be used to help determine the cause of hyperthyroidism. It is used once treatment for thyroid cancer has been completed, before and after radioactive iodine therapy, and at regular intervals to monitor for recurrence.  PREPARATION FOR TEST A blood sample is obtained by inserting a needle into a vein in the arm. NORMAL FINDINGS  Age: 83-11 months  Female: 0.6-5.5 ng/mL  Female: 0.5-5.5 ng/mL  Age: 81-56 years  Female: 0.6-50.1 ng/mL  Female: 0.5-52.1 ng/mL  Age: 70 years and older  Female: 0.5-53.0 ng/mL  Female: 0.5-43.0 ng/mL Ranges for normal findings may vary among different laboratories and hospitals. You should always check with your doctor after having lab work or other tests done to discuss the meaning of your test results and whether your values are considered within normal limits. MEANING OF TEST  Your caregiver will go over the test results with you and discuss the importance and meaning of your results, as well as treatment options and the need for additional tests if necessary. OBTAINING THE TEST RESULTS  It is your responsibility to obtain your test results. Ask the lab or department performing the test when and how you will get your results. Document Released: 11/13/2004 Document Revised: 01/03/2012 Document Reviewed: 09/22/2008 [facility] Patient Information 2013 [place], [place].

## 2013-12-19 ENCOUNTER — Other Ambulatory Visit: Payer: Self-pay

## 2014-02-15 ENCOUNTER — Other Ambulatory Visit: Payer: Self-pay | Admitting: Obstetrics & Gynecology

## 2014-02-15 DIAGNOSIS — R928 Other abnormal and inconclusive findings on diagnostic imaging of breast: Secondary | ICD-10-CM

## 2014-02-20 ENCOUNTER — Ambulatory Visit
Admission: RE | Admit: 2014-02-20 | Discharge: 2014-02-20 | Disposition: A | Discharge status: Home / Self Care | Payer: 59 | Source: Ambulatory Visit | Attending: Obstetrics & Gynecology | Admitting: Obstetrics & Gynecology

## 2014-02-20 DIAGNOSIS — R928 Other abnormal and inconclusive findings on diagnostic imaging of breast: Secondary | ICD-10-CM

## 2014-03-04 ENCOUNTER — Other Ambulatory Visit: Payer: BC Managed Care – PPO

## 2017-06-11 ENCOUNTER — Encounter: Payer: Self-pay | Admitting: *Deleted

## 2017-06-11 ENCOUNTER — Emergency Department
Admission: EM | Admit: 2017-06-11 | Discharge: 2017-06-11 | Disposition: A | Discharge status: Home / Self Care | Payer: Managed Care, Other (non HMO) | Source: Home / Self Care | Attending: Family Medicine | Admitting: Family Medicine

## 2017-06-11 DIAGNOSIS — J069 Acute upper respiratory infection, unspecified: Secondary | ICD-10-CM | POA: Diagnosis not present

## 2017-06-11 DIAGNOSIS — B9789 Other viral agents as the cause of diseases classified elsewhere: Secondary | ICD-10-CM | POA: Diagnosis not present

## 2017-06-11 HISTORY — DX: Essential (primary) hypertension: I10

## 2017-06-11 MED ORDER — AZITHROMYCIN 250 MG PO TABS: ORAL_TABLET | ORAL | 0 refills | Status: DC

## 2017-06-11 MED ORDER — PREDNISONE 20 MG PO TABS: ORAL_TABLET | ORAL | 0 refills | Status: DC

## 2017-06-11 NOTE — ED Provider Notes (Signed)
[name]    CSN: [id number] Arrival date & time: 06/11/17  1121     History   Chief Complaint Chief Complaint  Patient presents with  . Cough  . Nasal Congestion    HPI Yvonne Morris is a 61 y.o. female.   Patient complains of six day history of typical cold-like symptoms developing over several days, including sinus congestion, headache, low grade fever 99.4, and fatigue.  She developed a cough 3 days ago.  No sore throat.  She has a past history of mild asthma as a child, and notes that her colds tend to linger.  She also has seasonal rhinitis.  She has a family history of asthma (father). She has a follow-up visit with her PCP approximately September 5.   The history is provided by the patient.    Past Medical History:  Diagnosis Date  . Hypertension     There are no active problems to display for this patient.   Past Surgical History:  Procedure Laterality Date  . ABDOMINAL HYSTERECTOMY    . CHOLECYSTECTOMY    . THYROIDECTOMY      OB History    No data available       Home Medications    Prior to Admission medications   Medication Sig Start Date End Date Taking? Authorizing Provider  estradiol (ESTRACE) 2 MG tablet Take 2 mg by mouth daily.   Yes [provider]  irbesartan-hydrochlorothiazide (AVALIDE) 300-12.5 MG tablet Take 1 tablet by mouth daily.   Yes [provider]  levothyroxine (SYNTHROID, LEVOTHROID) 175 MCG tablet Take 175 mcg by mouth daily before breakfast.   Yes [provider]  PARoxetine (PAXIL) 20 MG tablet Take 20 mg by mouth daily.   Yes [provider]  azithromycin (ZITHROMAX Z-PAK) 250 MG tablet Take 2 tabs today; then begin one tab once daily for 4 more days. (Rx void after 06/19/17) 06/11/17   Kandra Nicolas, MD  predniSONE (DELTASONE) 20 MG tablet Take one tab by mouth twice daily for 5 days, then one daily for 3 days. Take with food. 06/11/17   Kandra Nicolas, MD    Family  History Family History  Problem Relation Age of Onset  . Stroke Father   . Hypertension Brother   . Hyperlipidemia Brother     Social History Social History  Substance Use Topics  . Smoking status: Never Smoker  . Smokeless tobacco: Never Used  . Alcohol use Yes     Allergies   Patient has no known allergies.   Review of Systems Review of Systems No sore throat + cough No pleuritic pain No wheezing + nasal congestion + post-nasal drainage No sinus pain/pressure No itchy/red eyes No earache No hemoptysis No SOB + fever, + chills No nausea No vomiting No abdominal pain No diarrhea No urinary symptoms No skin rash + fatigue + myalgias No headache Used OTC meds without relief   Physical Exam Triage Vital Signs ED Triage Vitals [06/11/17 1136]  Enc Vitals Group     BP (!) 145/91     Pulse Rate 89     Resp      Temp 97.9 F (36.6 C)     Temp Source Oral     SpO2 97 %     Weight 212 lb (96.2 kg)     Height      Head Circumference      Peak Flow      Pain Score 2  Pain Loc      Pain Edu?      Excl. in [place]?    No data found.   Updated Vital Signs BP (!) 145/91 (BP Location: Left Arm)   Pulse 89   Temp 97.9 F (36.6 C) (Oral)   Wt 212 lb (96.2 kg)   SpO2 97%   Visual Acuity Right Eye Distance:   Left Eye Distance:   Bilateral Distance:    Right Eye Near:   Left Eye Near:    Bilateral Near:     Physical Exam Nursing notes and Vital Signs reviewed. Appearance:  Patient appears stated age, and in no acute distress Eyes:  Pupils are equal, round, and reactive to light and accomodation.  Extraocular movement is intact.  Conjunctivae are not inflamed  Ears:  Canals normal.  Tympanic membranes normal.  Nose:  Mildly congested turbinates.  No sinus tenderness.    Pharynx:  Normal Neck:  Supple.  Nontender enlarged posterior/lateral nodes are palpated bilaterally    Lungs:  Clear to auscultation.  Breath sounds are equal.  Moving air  well. Heart:  Regular rate and rhythm without murmurs, rubs, or gallops.  Abdomen:  Nontender without masses or hepatosplenomegaly.  Bowel sounds are present.  No CVA or flank tenderness.  Extremities:  No edema.  Skin:  No rash present.    UC Treatments / Results  Labs (all labs ordered are listed, but only abnormal results are displayed) Labs Reviewed - No data to display  EKG  EKG Interpretation None       Radiology No results found.  Procedures Procedures (including critical care time)  Medications Ordered in UC Medications - No data to display   Initial Impression / Assessment and Plan / UC Course  I have reviewed the triage vital signs and the nursing notes.  Pertinent labs & imaging results that were available during my care of the patient were reviewed by me and considered in my medical decision making (see chart for details).    There is no evidence of bacterial infection today.   With patient's history of seasonal rhinitis, family history of asthma, and past history of viral URI's that linger, she probably has a predisposition to mild reactive airways disease.  She now has congestion of a viral URI superimposed on her usual seasonal rhinitis.    Begin prednisone burst/taper. Take plain guaifenesin (1200mg  extended release tabs such as Mucinex) twice daily, with plenty of water, for cough and congestion.  Get adequate rest.   May use Afrin nasal spray (or generic oxymetazoline) each morning for about 5 days and then discontinue.  Also recommend using saline nasal spray several times daily and saline nasal irrigation (AYR is a common brand).  Use Flonase nasal spray each morning after using Afrin nasal spray and saline nasal irrigation. Try warm salt water gargles for sore throat.  Stop all antihistamines for now, and other non-prescription cough/cold preparations. May take Delsym Cough Suppressant at bedtime for nighttime cough.  Begin Azithromycin if not improving  about one week or if persistent fever develops (Given a prescription to hold, with an expiration date)  Followup with PCP as scheduled in September.    Final Clinical Impressions(s) / UC Diagnoses   Final diagnoses:  Viral URI with cough    New Prescriptions New Prescriptions   AZITHROMYCIN (ZITHROMAX Z-PAK) 250 MG TABLET    Take 2 tabs today; then begin one tab once daily for 4 more days. (Rx void after 06/19/17)  PREDNISONE (DELTASONE) 20 MG TABLET    Take one tab by mouth twice daily for 5 days, then one daily for 3 days. Take with food.        Kandra Nicolas, MD 06/11/17 [phone]

## 2017-06-11 NOTE — Discharge Instructions (Signed)
Take plain guaifenesin (1200mg extended release tabs such as Mucinex) twice daily, with plenty of water, for cough and congestion.  Get adequate rest.   °May use Afrin nasal spray (or generic oxymetazoline) each morning for about 5 days and then discontinue.  Also recommend using saline nasal spray several times daily and saline nasal irrigation (AYR is a common brand).  Use Flonase nasal spray each morning after using Afrin nasal spray and saline nasal irrigation. °Try warm salt water gargles for sore throat.  °Stop all antihistamines for now, and other non-prescription cough/cold preparations. °May take Delsym Cough Suppressant at bedtime for nighttime cough.  °Begin Azithromycin if not improving about one week or if persistent fever develops   °

## 2017-06-11 NOTE — ED Triage Notes (Signed)
Patient c/o 6 days of cough, and congestion. Taken Dayquil and generic cough suppressant with DM otc. Afebrile.

## 2017-06-12 ENCOUNTER — Encounter (INDEPENDENT_AMBULATORY_CARE_PROVIDER_SITE_OTHER): Payer: Self-pay | Admitting: Surgery

## 2018-02-20 ENCOUNTER — Ambulatory Visit (HOSPITAL_COMMUNITY)
Admission: RE | Admit: 2018-02-20 | Discharge: 2018-02-20 | Disposition: A | Discharge status: Home / Self Care | Payer: Managed Care, Other (non HMO) | Source: Ambulatory Visit | Attending: Surgery | Admitting: Surgery

## 2018-02-20 ENCOUNTER — Other Ambulatory Visit (HOSPITAL_COMMUNITY): Payer: Self-pay | Admitting: Internal Medicine

## 2018-02-20 DIAGNOSIS — M79605 Pain in left leg: Secondary | ICD-10-CM | POA: Insufficient documentation

## 2018-02-20 DIAGNOSIS — R52 Pain, unspecified: Secondary | ICD-10-CM | POA: Diagnosis not present

## 2018-03-12 ENCOUNTER — Emergency Department
Admission: EM | Admit: 2018-03-12 | Discharge: 2018-03-12 | Disposition: A | Discharge status: Home / Self Care | Payer: Managed Care, Other (non HMO) | Source: Home / Self Care | Attending: Family Medicine | Admitting: Family Medicine

## 2018-03-12 ENCOUNTER — Emergency Department (INDEPENDENT_AMBULATORY_CARE_PROVIDER_SITE_OTHER): Payer: Managed Care, Other (non HMO)

## 2018-03-12 ENCOUNTER — Encounter: Payer: Self-pay | Admitting: Emergency Medicine

## 2018-03-12 DIAGNOSIS — R05 Cough: Secondary | ICD-10-CM

## 2018-03-12 DIAGNOSIS — J189 Pneumonia, unspecified organism: Secondary | ICD-10-CM

## 2018-03-12 DIAGNOSIS — R509 Fever, unspecified: Secondary | ICD-10-CM

## 2018-03-12 DIAGNOSIS — E860 Dehydration: Secondary | ICD-10-CM

## 2018-03-12 LAB — COMPLETE METABOLIC PANEL WITH GFR
AG Ratio: 1 (calc) (ref 1.0–2.5)
ALT: 17 U/L (ref 6–29)
AST: 21 U/L (ref 10–35)
Albumin: 3.4 g/dL — ABNORMAL LOW (ref 3.6–5.1)
Alkaline phosphatase (APISO): 84 U/L (ref 33–130)
BUN: 11 mg/dL (ref 7–25)
CO2: 27 mmol/L (ref 20–32)
Calcium: 7.1 mg/dL — ABNORMAL LOW (ref 8.6–10.4)
Chloride: 93 mmol/L — ABNORMAL LOW (ref 98–110)
Creat: 0.68 mg/dL (ref 0.50–0.99)
GFR, Est African American: 109 mL/min/{1.73_m2} (ref >=60)
GFR, Est Non African American: 94 mL/min/{1.73_m2} (ref >=60)
Globulin: 3.4 g/dL (calc) (ref 1.9–3.7)
Glucose, Bld: 102 mg/dL — ABNORMAL HIGH (ref 65–99)
Potassium: 3.2 mmol/L — ABNORMAL LOW (ref 3.5–5.3)
Sodium: 135 mmol/L (ref 135–146)
Total Bilirubin: 0.4 mg/dL (ref 0.2–1.2)
Total Protein: 6.8 g/dL (ref 6.1–8.1)

## 2018-03-12 LAB — POCT CBC W AUTO DIFF (K'VILLE URGENT CARE)

## 2018-03-12 LAB — POCT URINALYSIS DIP (MANUAL ENTRY)
Glucose, UA: NEGATIVE mg/dL
Leukocytes, UA: NEGATIVE
Nitrite, UA: NEGATIVE
Protein Ur, POC: >=300 mg/dL — AB
Spec Grav, UA: 1.025 (ref 1.010–1.025)
Urobilinogen, UA: 0.2 E.U./dL
pH, UA: 6.5 (ref 5.0–8.0)

## 2018-03-12 MED ORDER — ALBUTEROL SULFATE HFA 108 (90 BASE) MCG/ACT IN AERS: 1.0000 | INHALATION_SPRAY | Freq: Four times a day (QID) | RESPIRATORY_TRACT | 0 refills | Status: DC | PRN

## 2018-03-12 MED ORDER — CEFTRIAXONE SODIUM 1 G IJ SOLR
1.0000 g | Freq: Once | INTRAMUSCULAR | Status: AC
  Administered 2018-03-12: 1 g via INTRAMUSCULAR

## 2018-03-12 MED ORDER — ONDANSETRON HCL 4 MG PO TABS: 4.0000 mg | ORAL_TABLET | Freq: Four times a day (QID) | ORAL | 0 refills | Status: DC

## 2018-03-12 MED ORDER — AZITHROMYCIN 250 MG PO TABS: 250.0000 mg | ORAL_TABLET | Freq: Every day | ORAL | 0 refills | Status: DC

## 2018-03-12 MED ORDER — SODIUM CHLORIDE 0.9 % IV BOLUS
1000.0000 mL | Freq: Once | INTRAVENOUS | Status: AC
  Administered 2018-03-12: 1000 mL via INTRAVENOUS

## 2018-03-12 NOTE — Discharge Instructions (Signed)
°  You may take 500mg  acetaminophen every 4-6 hours or in combination with ibuprofen 400-600mg  every 6-8 hours as needed for pain, inflammation, and fever.  Be sure to drink at least eight 8oz glasses of water to stay well hydrated and get at least 8 hours of sleep at night, preferably more while sick.   Please take antibiotics as prescribed and be sure to complete entire course even if you start to feel better to ensure infection does not come back.  Please follow up with your family doctor in 3-4 days if not improving, sooner if worsening.  Do not hesitate to call [phone] or go to closest emergency department for further evaluation and treatment if symptoms worsening- difficulty breathing, dizziness or passing out, unable to keep down fluids, or other new concerning symptoms develop.

## 2018-03-12 NOTE — ED Provider Notes (Signed)
[name]    CSN: [id number] Arrival date & time: 03/12/18  1353     History   Chief Complaint Chief Complaint  Patient presents with  . Fever    HPI Yvonne Morris is a 62 y.o. female.   HPI Yvonne Morris is a 62 y.o. female presenting to UC with c/o 4-5 days of not feeling well, which started after eating at a [place] and having a margarita.  Pt states she has eaten the same thing there in the past and has had margaritas before but has never felt like this after.  Her husband also ate at [employer] and drank a margarita but feels fine.  Today she is c/o a generalized HA, intermittent fever, Tmax 101, chills, nausea, vomiting and one episode of diarrhea that started today.  She has been able to keep down a small amount of water earlier today.  She has been taking Tylenol and leftover Zofran, which tends to last 8 hours but then she becomes nauseated and vomits once it wears off.  No recent travel. No rashes or known tick bites.  She does have a mild cough but she attributes that to her vomiting.     Past Medical History:  Diagnosis Date  . Hypertension   . Thyroid cancer [facility])     Patient Active Problem List   Diagnosis Date Noted  . 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 ABDOMINAL HYSTERECTOMY  1999  . ABDOMINAL HYSTERECTOMY    . CHOLECYSTECTOMY  03/08/2012   Procedure: LAPAROSCOPIC CHOLECYSTECTOMY WITH INTRAOPERATIVE CHOLANGIOGRAM;  Surgeon: Odis Hollingshead, MD;  Location: [facility];  Service: General;  Laterality: N/A;  . CHOLECYSTECTOMY    . THYROIDECTOMY    . TOTAL THYROIDECTOMY  2011, 2012   x2    OB History   None      Home Medications    Prior to Admission medications   Medication Sig Start Date End Date Taking? Authorizing Provider  metoprolol tartrate (LOPRESSOR) 50 MG tablet Take 50 mg by mouth 2 (two) times daily.   Yes [provider]  ondansetron (ZOFRAN) 4 MG tablet Take 4 mg by mouth every 8 (eight) hours as needed for nausea or vomiting.   Yes [provider]  albuterol (PROVENTIL HFA;VENTOLIN HFA) 108 (90 Base) MCG/ACT inhaler Inhale 1-2 puffs into the lungs every 6 (six) hours as needed for wheezing or shortness of breath. 03/12/18   Noe Gens, PA-C  azithromycin (ZITHROMAX) 250 MG tablet Take 1 tablet (250 mg total) by mouth daily. Take first 2 tablets together, then 1 every day until finished. 03/12/18   Noe Gens, PA-C  estradiol (ESTRACE) 2 MG tablet Take 2 mg by mouth daily.    [provider]  irbesartan-hydrochlorothiazide (AVALIDE) 300-12.5 MG tablet Take 1 tablet by mouth daily.    [provider]  levothyroxine (SYNTHROID, LEVOTHROID) 175 MCG tablet Take 175 mcg by mouth daily.  08/30/11   [provider]  ondansetron (ZOFRAN) 4 MG tablet Take 1 tablet (4 mg total) by mouth every 6 (six) hours. 03/12/18   Noe Gens, PA-C  PARoxetine (PAXIL) 20 MG tablet Take 20 mg by mouth daily.    [provider]    Family History Family History  Problem Relation Age of Onset  . Stroke Father   . Hypertension Brother   . Hyperlipidemia Brother     Social  History Social History   Tobacco Use  . Smoking status: Never Smoker  . Smokeless tobacco: Never Used  Substance Use Topics  . Alcohol use: Yes    Comment: 1 glass of wine every couple days per pt  . Drug use: No     Allergies   Patient has no known allergies.   Review of Systems Review of Systems  Constitutional: Positive for appetite change, chills, fatigue and fever.  HENT: Positive for congestion ( minimal). Negative for ear pain, sore throat, trouble swallowing and voice change.   Respiratory: Positive for cough ( minimal). Negative for shortness of breath.   Cardiovascular: Negative for chest pain and palpitations.  Gastrointestinal: Positive for diarrhea, nausea and vomiting. Negative for  abdominal pain.  Genitourinary: Positive for decreased urine volume. Negative for dysuria, frequency and urgency.  Musculoskeletal: Negative for arthralgias, back pain and myalgias.  Skin: Negative for rash.  Neurological: Positive for headaches. Negative for dizziness and light-headedness.  All other systems reviewed and are negative.    Physical Exam Triage Vital Signs ED Triage Vitals [03/12/18 1437]  Enc Vitals Group     BP 126/86     Pulse Rate (!) 107     Resp      Temp (!) 101.7 F (38.7 C)     Temp Source Oral     SpO2      Weight 208 lb (94.3 kg)     Height 5\' 7"  (1.702 m)     Head Circumference      Peak Flow      Pain Score 0     Pain Loc      Pain Edu?      Excl. in [place]?    No data found.  Updated Vital Signs BP 126/86 (BP Location: Right Arm)   Pulse 86   Temp 98.5 F (36.9 C) (Oral)   Ht 5\' 7"  (1.702 m)   Wt 208 lb (94.3 kg)   SpO2 95%   BMI 32.58 kg/m   Visual Acuity Right Eye Distance:   Left Eye Distance:   Bilateral Distance:    Right Eye Near:   Left Eye Near:    Bilateral Near:     Physical Exam  Constitutional: She is oriented to person, place, and time. She appears well-developed and well-nourished.  Pt lying on exam bed, appears fatigued and acutely sick. Alert and cooperative during exam.  HENT:  Head: Normocephalic and atraumatic.  Right Ear: Tympanic membrane normal.  Left Ear: Tympanic membrane normal.  Nose: Nose normal. Right sinus exhibits no maxillary sinus tenderness and no frontal sinus tenderness. Left sinus exhibits no maxillary sinus tenderness and no frontal sinus tenderness.  Mouth/Throat: Uvula is midline, oropharynx is clear and moist and mucous membranes are normal.  Eyes: EOM are normal.  Neck: Normal range of motion. Neck supple.  Cardiovascular: Normal rate and regular rhythm.  Pulmonary/Chest: Effort normal and breath sounds normal. No stridor. No respiratory distress. She has no wheezes. She has no rales.    Abdominal: Soft. She exhibits no distension. There is no tenderness. There is no CVA tenderness.  Musculoskeletal: Normal range of motion.  Lymphadenopathy:    She has no cervical adenopathy.  Neurological: She is alert and oriented to person, place, and time.  Skin: Skin is warm and dry. She is not diaphoretic.  Psychiatric: She has a normal mood and affect. Her behavior is normal.  Nursing note and vitals reviewed.    UC Treatments / Results  Labs (all labs ordered are listed, but only abnormal results are displayed) Labs Reviewed  POCT URINALYSIS DIP (MANUAL ENTRY) - Abnormal; Notable for the following components:      Result Value   Bilirubin, UA moderate (*)    Ketones, POC UA >= (160) (*)    Blood, UA moderate (*)    Protein Ur, POC >=300 (*)    All other components within normal limits  COMPLETE METABOLIC PANEL WITH GFR  POCT CBC W AUTO DIFF ([facility])    EKG None  Radiology Dg Chest 2 View  Result Date: 03/12/2018 CLINICAL DATA:  Cough and fever EXAM: CHEST - 2 VIEW COMPARISON:  03/08/2012 FINDINGS: Normal cardiac silhouette. There is subtle obscuration of the LEFT heart border. RIGHT lung clear. No pleural fluid. No pneumothorax. IMPRESSION: Concern for lingular pneumonia Electronically Signed   By: Suzy Bouchard M.D.   On: 03/12/2018 15:16    Procedures Procedures (including critical care time)  Medications Ordered in UC Medications  sodium chloride 0.9 % bolus 1,000 mL (0 mLs Intravenous Stopped 03/12/18 1630)  cefTRIAXone (ROCEPHIN) injection 1 g (1 g Intramuscular Given 03/12/18 1602)    Initial Impression / Assessment and Plan / UC Course  I have reviewed the triage vital signs and the nursing notes.  Pertinent labs & imaging results that were available during my care of the patient were reviewed by me and considered in my medical decision making (see chart for details).     CBC and UA: unremarkable  CXR: concerning for lingular  pneumonia Given symptoms, will tx for likely pneumonia. Pt feels some improvement after tx in UC. HA improved from 8/10 to 2/10.  Temp down from 101.7*F to 98.5*, HR improved from 107 to 86.  Pt feels comfortable being discharged home.  Home care instructions provided below.    Final Clinical Impressions(s) / UC Diagnoses   Final diagnoses:  Lingular pneumonia  Mild dehydration     Discharge Instructions      You may take 500mg  acetaminophen every 4-6 hours or in combination with ibuprofen 400-600mg  every 6-8 hours as needed for pain, inflammation, and fever.  Be sure to drink at least eight 8oz glasses of water to stay well hydrated and get at least 8 hours of sleep at night, preferably more while sick.   Please take antibiotics as prescribed and be sure to complete entire course even if you start to feel better to ensure infection does not come back.  Please follow up with your family doctor in 3-4 days if not improving, sooner if worsening.  Do not hesitate to call 911 or go to closest emergency department for further evaluation and treatment if symptoms worsening- difficulty breathing, dizziness or passing out, unable to keep down fluids, or other new concerning symptoms develop.    ED Prescriptions    Medication Sig Dispense Auth. Provider   azithromycin (ZITHROMAX) 250 MG tablet Take 1 tablet (250 mg total) by mouth daily. Take first 2 tablets together, then 1 every day until finished. 6 tablet Gerarda Fraction, Shayli Altemose O, PA-C   albuterol (PROVENTIL HFA;VENTOLIN HFA) 108 (90 Base) MCG/ACT inhaler Inhale 1-2 puffs into the lungs every 6 (six) hours as needed for wheezing or shortness of breath. 1 Inhaler Gerarda Fraction, Adaiah Morken O, PA-C   ondansetron (ZOFRAN) 4 MG tablet Take 1 tablet (4 mg total) by mouth every 6 (six) hours. 12 tablet Noe Gens, PA-C     Controlled Substance Prescriptions [place] Controlled Substance Registry consulted? Not  Applicable   Noe Gens, PA-C 03/12/18  1648

## 2018-03-12 NOTE — ED Triage Notes (Signed)
Patient c/o fever, HA, became sick Wednesday night after eating [place], fever, nausea, diarrhea, vomiting, taking Tylenol, no abdominal pain, body aches.

## 2020-03-12 ENCOUNTER — Other Ambulatory Visit (HOSPITAL_COMMUNITY): Payer: Self-pay | Admitting: Internal Medicine

## 2020-03-12 DIAGNOSIS — R011 Cardiac murmur, unspecified: Secondary | ICD-10-CM

## 2020-04-02 ENCOUNTER — Ambulatory Visit (HOSPITAL_COMMUNITY): Payer: Managed Care, Other (non HMO) | Attending: Cardiology

## 2020-04-02 ENCOUNTER — Other Ambulatory Visit: Payer: Self-pay

## 2020-04-02 DIAGNOSIS — R011 Cardiac murmur, unspecified: Secondary | ICD-10-CM | POA: Diagnosis present

## 2020-11-11 ENCOUNTER — Other Ambulatory Visit: Payer: Self-pay | Admitting: Obstetrics & Gynecology

## 2020-11-11 DIAGNOSIS — R928 Other abnormal and inconclusive findings on diagnostic imaging of breast: Secondary | ICD-10-CM

## 2020-11-20 ENCOUNTER — Ambulatory Visit: Payer: Managed Care, Other (non HMO)

## 2020-11-20 ENCOUNTER — Ambulatory Visit
Admission: RE | Admit: 2020-11-20 | Discharge: 2020-11-20 | Disposition: A | Discharge status: Home / Self Care | Payer: Managed Care, Other (non HMO) | Source: Ambulatory Visit | Attending: Obstetrics & Gynecology | Admitting: Obstetrics & Gynecology

## 2020-11-20 ENCOUNTER — Other Ambulatory Visit: Payer: Self-pay

## 2020-11-20 DIAGNOSIS — R928 Other abnormal and inconclusive findings on diagnostic imaging of breast: Secondary | ICD-10-CM

## 2020-11-24 ENCOUNTER — Other Ambulatory Visit: Payer: Managed Care, Other (non HMO)

## 2022-05-20 ENCOUNTER — Other Ambulatory Visit: Payer: Self-pay | Admitting: Internal Medicine

## 2022-05-20 DIAGNOSIS — E785 Hyperlipidemia, unspecified: Secondary | ICD-10-CM

## 2022-08-10 ENCOUNTER — Ambulatory Visit
Admission: RE | Admit: 2022-08-10 | Discharge: 2022-08-10 | Disposition: A | Discharge status: Home / Self Care | Payer: No Typology Code available for payment source | Source: Ambulatory Visit | Attending: Internal Medicine | Admitting: Internal Medicine

## 2022-08-10 DIAGNOSIS — E785 Hyperlipidemia, unspecified: Secondary | ICD-10-CM

## 2023-01-25 IMAGING — MG MM DIGITAL DIAGNOSTIC UNILAT*L* W/ TOMO W/ CAD
4 series · 4 of 12 positions shown · non-contrast
Comparison: Previous exam(s).

CLINICAL DATA: Screening recall for a possible left breast
architectural distortion.

EXAM:
DIGITAL DIAGNOSTIC UNILATERAL LEFT MAMMOGRAM WITH TOMO AND CAD
TECHNIQUE: Left digital diagnostic mammography and breast tomosynthesis was
performed. Digital images of the breasts were evaluated with
computer-aided detection.

[L ML synth-2D]
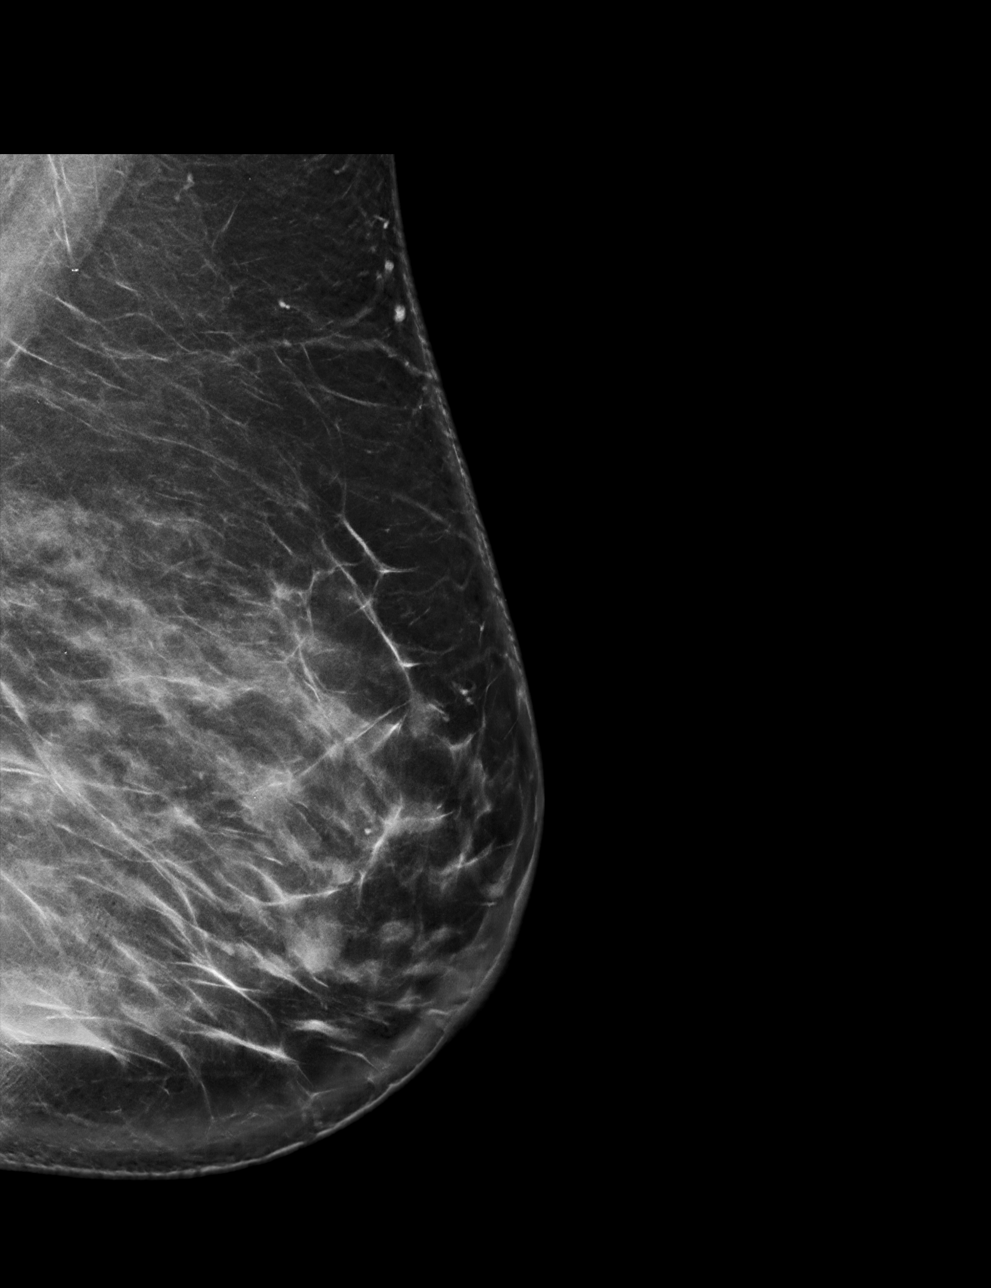

[L CC synth-2D]
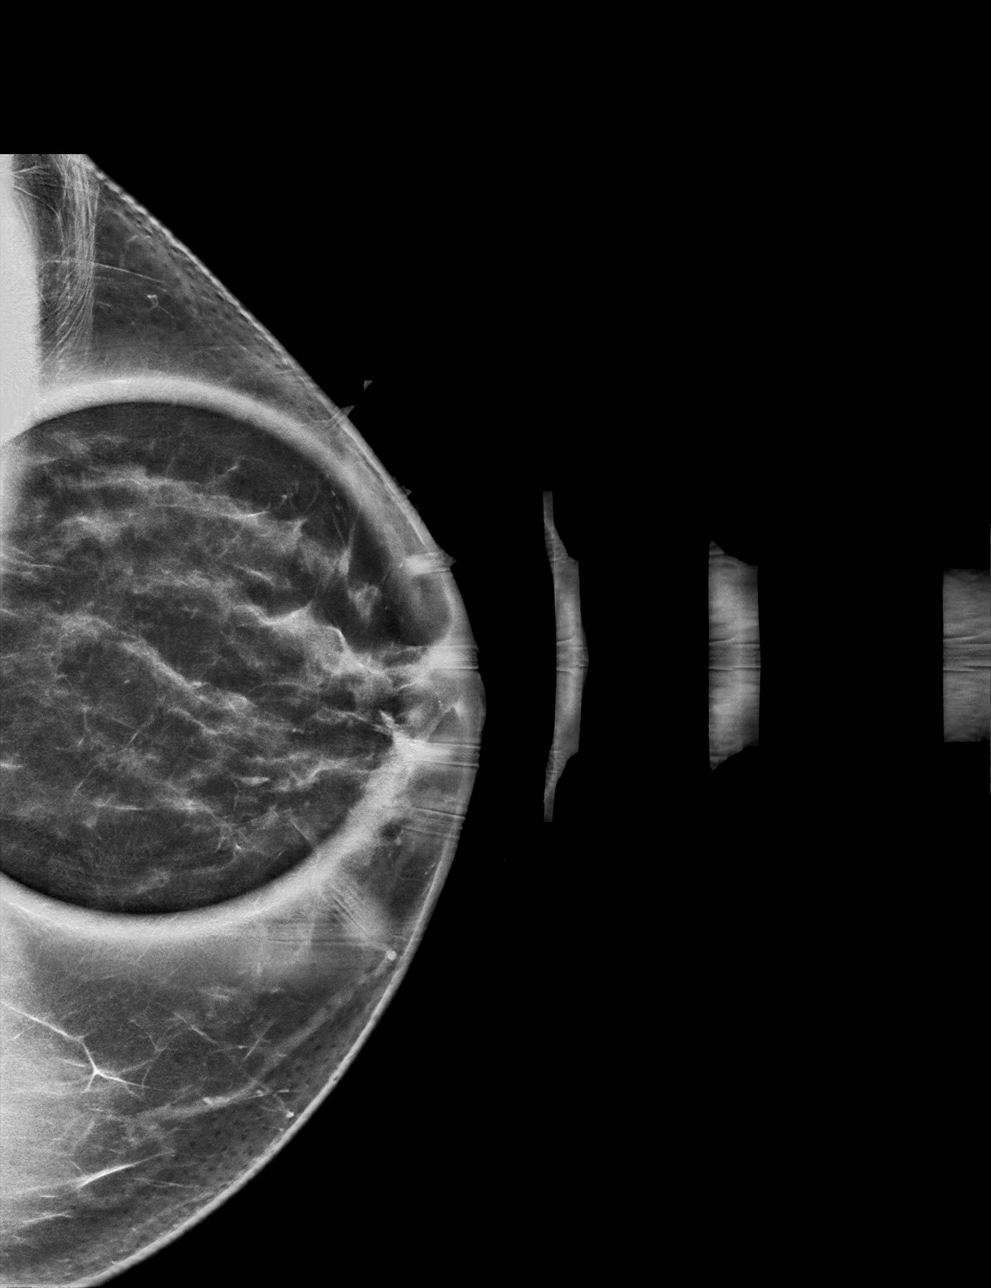

[L CC tomo · tomo slice 35/68.0]
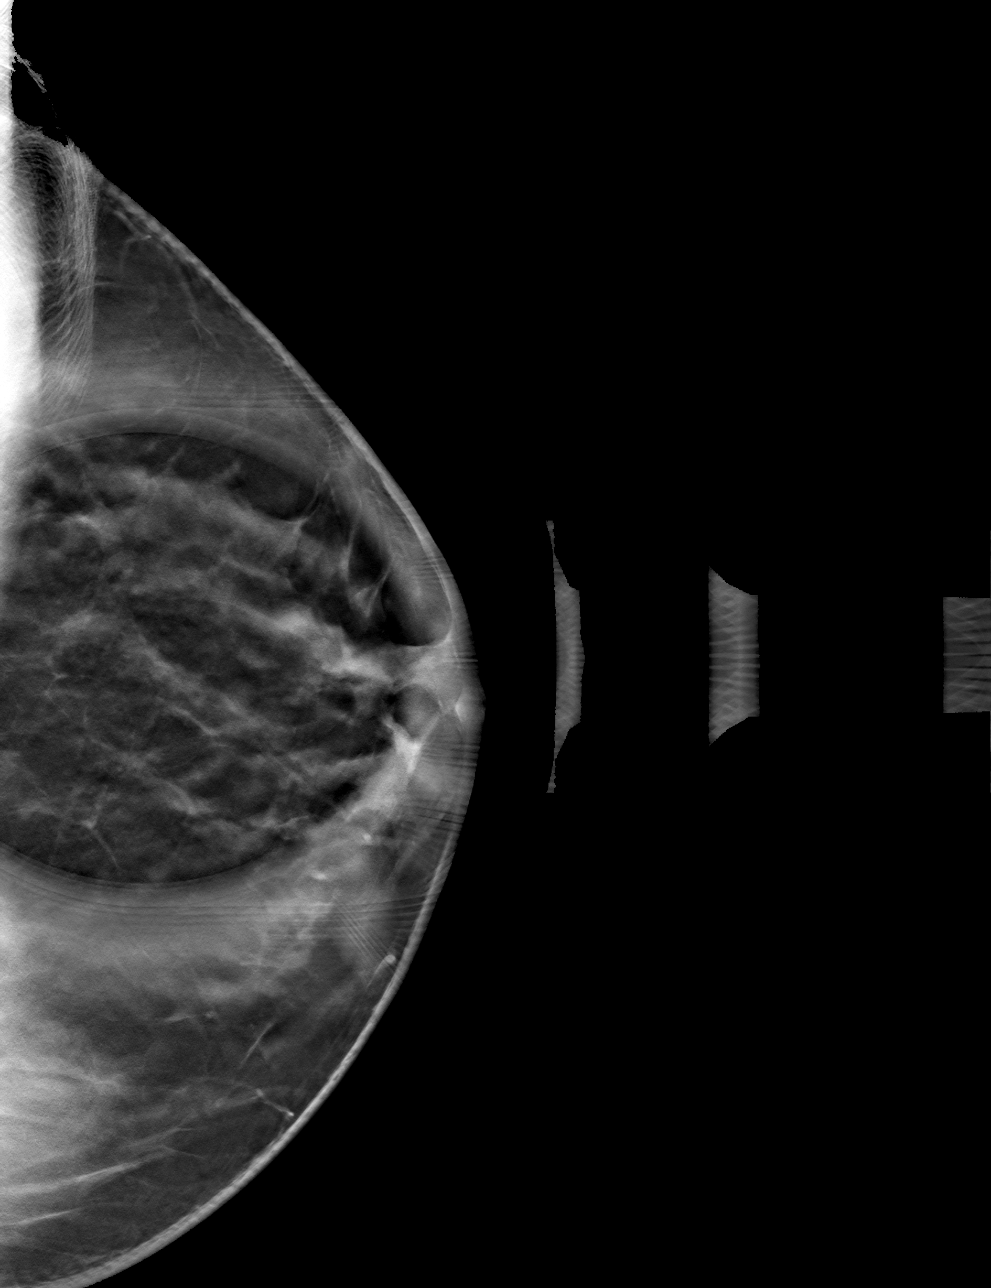

[L ML tomo · tomo slice 48/95.0]
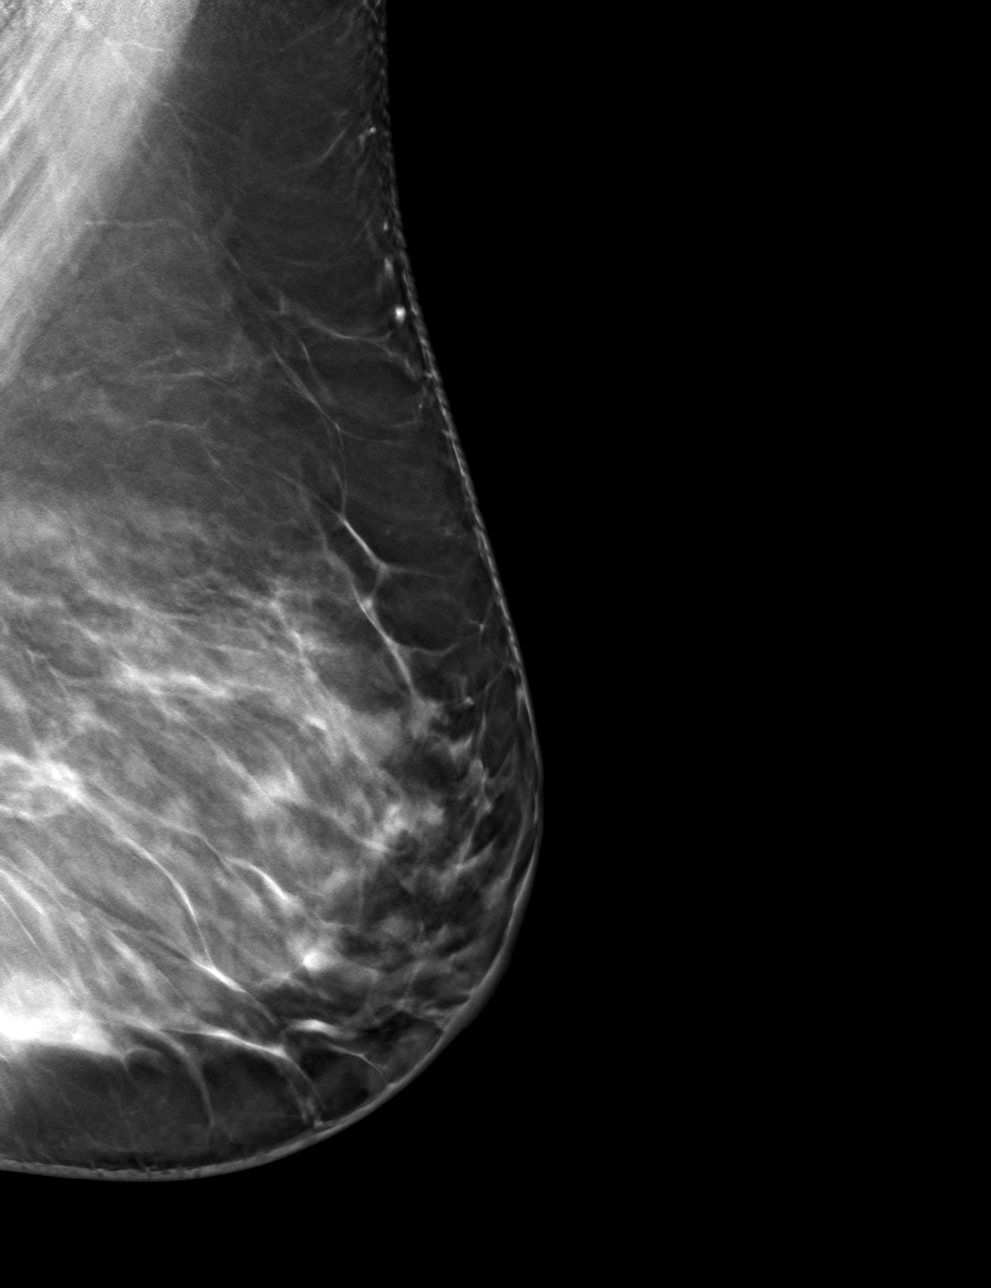

[4 of 12 positions shown; findings below may reference images not displayed]

ACR Breast Density Category c: The breast tissue is heterogeneously
dense, which may obscure small masses.
FINDINGS: The possible architectural distortion, which was noted on the
screening left breast cc view, disperses with spot compression
imaging consistent with normal superimposed fibroglandular tissue.
There is no residual/true distortion, no underlying mass and no
significant asymmetry. There are no suspicious calcifications.
IMPRESSION: No evidence of breast malignancy.

RECOMMENDATION:
Screening mammogram in one year.(Code:[id number])

I have discussed the findings and recommendations with the patient.
If applicable, a reminder letter will be sent to the patient
regarding the next appointment.

BI-RADS CATEGORY  1: Negative.

## 2024-05-03 ENCOUNTER — Other Ambulatory Visit: Payer: Self-pay | Admitting: Obstetrics and Gynecology

## 2024-05-03 DIAGNOSIS — N6452 Nipple discharge: Secondary | ICD-10-CM

## 2024-05-08 ENCOUNTER — Ambulatory Visit
Admission: RE | Admit: 2024-05-08 | Discharge: 2024-05-08 | Disposition: A | Discharge status: Home / Self Care | Source: Ambulatory Visit | Attending: Obstetrics and Gynecology | Admitting: Obstetrics and Gynecology

## 2024-05-08 DIAGNOSIS — N6452 Nipple discharge: Secondary | ICD-10-CM

## 2024-07-16 ENCOUNTER — Other Ambulatory Visit: Payer: Self-pay | Admitting: Internal Medicine

## 2024-07-16 DIAGNOSIS — I77810 Thoracic aortic ectasia: Secondary | ICD-10-CM

## 2024-07-25 NOTE — Therapy (Unsigned)
 OUTPATIENT PHYSICAL THERAPY LOWER EXTREMITY EVALUATION   Patient Name: Yvonne Morris MRN: [id number] DOB:11/18/1955, 68 y.o., female Today's Date: 07/26/2024  END OF SESSION:  PT End of Session - 07/26/24 1710     Visit Number 1    Number of Visits 16    Date for Recertification  10/04/24    Authorization Type BCBS + medicare    Progress Note Due on Visit 10    PT Start Time 1530    PT Stop Time 1615    PT Time Calculation (min) 45 min    Equipment Utilized During Treatment Gait belt    Activity Tolerance Patient tolerated treatment well          Past Medical History:  Diagnosis Date   Hypertension    Thyroid  cancer (HCC)    Past Surgical History:  Procedure Laterality Date   ABDOMINAL HYSTERECTOMY  1999   ABDOMINAL HYSTERECTOMY     CHOLECYSTECTOMY  03/08/2012   Procedure: LAPAROSCOPIC CHOLECYSTECTOMY WITH INTRAOPERATIVE CHOLANGIOGRAM;  Surgeon: Krystal JINNY Russell, MD;  Location: [facility];  Service: General;  Laterality: N/A;   CHOLECYSTECTOMY     THYROIDECTOMY     TOTAL THYROIDECTOMY  2011, 2012   x2   Patient Active Problem List   Diagnosis Date Noted   Hurthle cell neoplasm of thyroid , minimally invasive 08/30/2011    PCP: Dr Janey Band  REFERRING PROVIDER: Dr Toribio DELENA Higashi  REFERRING DIAG: Knee pain   THERAPY DIAG:  Chronic pain of right knee  Other symptoms and signs involving the musculoskeletal system  Abnormal weight gain  Rationale for Evaluation and Treatment: Rehabilitation  ONSET DATE: 07/26/23  SUBJECTIVE:   SUBJECTIVE STATEMENT: Patient reports that she has had trouble with the R knee for the past year with no injury. She has pain on an intermittent basis. She has good days and bad days. Pain increased with full extension when walking, steps, standing. She is scheduled for R TKA 08/10/24.  PERTINENT HISTORY: History of L TKA 2020; HTN; arthritis PAIN:  Are you having pain? Yes: NPRS scale: 1/10; worst in past week  8/10 Pain location: medial R knee  Pain description: sharp at times; aching, nagging  Aggravating factors: standing; steps; squatting; sitting with knees bent and straightening knee; walking with knee straight  Relieving factors: meds; ice; brace  PRECAUTIONS: None  RED FLAGS: None   WEIGHT BEARING RESTRICTIONS: No  FALLS:  Has patient fallen in last 6 months? 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  DIAGNOSTIC FINDINGS: xray R knee arthritis; bone spurs   PATIENT SURVEYS:  LEFS  Extreme difficulty/unable (0), Quite a bit of difficulty (1), Moderate difficulty (2), Little difficulty (3), No difficulty (4) Survey date:  07/26/24  Any of your usual work, housework or school activities 1  2. Usual hobbies, recreational or sporting activities 0  3. Getting into/out of the bath 4  4. Walking between rooms 3  5. Putting on socks/shoes 2  6. Squatting  0  7. Lifting an object, like a bag of groceries from the floor 0  8. Performing light activities around your home 2  9. Performing heavy activities around your home 0  10. Getting into/out of a car 1  11. Walking 2 blocks 0  12. Walking 1 mile 0  13. Going up/down 10 stairs (1 flight) 1  14. Standing for 1 hour 0  15.  sitting for 1 hour 2  16. Running on even ground 0  17. Running on uneven ground 0  18. Making  sharp turns while running fast 0  19. Hopping  0  20. Rolling over in bed 4  Score total:  20     COGNITION: Overall cognitive status: Within functional limits for tasks assessed     SENSATION: WFL  EDEMA:  WNL  MUSCLE LENGTH: Hamstrings: Right 50 deg; Left 50 deg Thomas test: Right -10 deg; Left 0 deg  POSTURE: rounded shoulders, forward head, flexed trunk , and weight shift left  PALPATION: Tenderness medial R knee   LOWER EXTREMITY ROM:  Active ROM Right eval Left eval  Hip flexion [facility] WFL  Hip extension -10 0  Hip abduction    Hip adduction    Hip internal rotation    Hip external rotation    Knee flexion 122 128  Knee extension -6 -3  Ankle dorsiflexion    Ankle plantarflexion    Ankle inversion    Ankle eversion     (Blank rows = not tested)  LOWER EXTREMITY MMT:  MMT Right eval Left eval  Hip flexion 4+ 5  Hip extension 4 4+  Hip abduction 4 4+  Hip adduction    Hip internal rotation    Hip external rotation    Knee flexion 4 5  Knee extension 4 5  Ankle dorsiflexion    Ankle plantarflexion    Ankle inversion    Ankle eversion     (Blank rows = not tested)   FUNCTIONAL TESTS:  5 times sit to stand: 15.81 pain with last reps - no use of hands  Single leg stance R 3 sec; L 2 sec   GAIT: Distance walked: 40 feet Assistive device utilized: None Level of assistance: Complete Independence Comments: decreased wt bearing R LE in R stance phase of gait                                                                                                                                 TREATMENT DATE: POC; pre-hab exercises; post op instructions     PATIENT EDUCATION:  Education details: POC; HEP  Person educated: Patient Education method: Programmer, multimedia, Demonstration, Tactile cues, Verbal cues, and Handouts Education comprehension: verbalized understanding, returned demonstration, verbal cues required, tactile cues required, and needs further  education  HOME EXERCISE PROGRAM: Access Code: [email or web address] URL: [email or web address] Date:  07/26/2024 Prepared by: Karin Griffith  Exercises - Hooklying Hamstring Stretch with Strap  - 1 x daily - 7 x weekly - 1 sets - 3 reps - 30 sec  hold - Supine ITB Stretch with Strap  - 1 x daily - 7 x weekly - 1 sets - 3 reps - 30 sec  hold - Prone Quadriceps Stretch with Strap  - 1 x daily - 7 x weekly - 1 sets - 3 reps - 30 sec  hold - Supine Piriformis Stretch with Leg Straight  - 1 x daily - 7 x weekly - 1 sets - 3 reps - 30 sec  hold - Hip Flexor Stretch at Edge of Bed  - 2 x daily - 7 x weekly - 1 sets - 3 reps - 30 sec  hold - Supine Quad Set  - 2 x daily - 7 x weekly - 1-3 sets - 10 reps - 3 sec  hold - Small Range Straight Leg Raise  - 2 x daily - 7 x weekly - 1-3 sets - 10 reps - 5 sec  hold - Sidelying Hip Abduction  - 1 x daily - 7 x weekly - 1-3 sets - 10 reps - 3-5 sec  hold - Bridge  - 2 x daily - 7 x weekly - 1-2 sets - 10 reps - 5 sec  hold  ASSESSMENT:  CLINICAL IMPRESSION: Patient is a 68 y.o. female who was seen today for physical therapy evaluation and treatment for R knee pain which has been present for the past year with no known injury. Diagnostic testing shows arthritic changes and bone spurs in R knee. Patient presents with sings and symptoms consistent with arthritic changes in R knee. Patient has decreased R LE ROM and mobility; decreased strength; poor balance; abnormal gait; pain with functional activities. She is scheduled for R TKA 08/10/24.   OBJECTIVE IMPAIRMENTS: Abnormal gait, decreased activity tolerance, decreased mobility, difficulty walking, decreased ROM, decreased strength, hypomobility, postural dysfunction, and pain.   ACTIVITY LIMITATIONS: carrying, lifting, bending, sitting, standing, squatting, sleeping, stairs, transfers, and locomotion level  PARTICIPATION LIMITATIONS: meal prep, cleaning, laundry, driving, shopping, and community  activity  PERSONAL FACTORS: Fitness, Past/current experiences, Profession, Time since onset of injury/illness/exacerbation, and comorbidities as noted in history are also affecting patient's functional outcome.   REHAB POTENTIAL: Good  CLINICAL DECISION MAKING: Evolving/moderate complexity  EVALUATION COMPLEXITY: Moderate   GOALS: Goals reviewed with patient? Yes  SHORT TERM GOALS: Target date: 07/26/24  Independent in initial HEP for pre-hab for R TKA  Baseline: Goal status: met  2.  Instruction in activities and precautions post op R TKA  Baseline:  Goal status: met  3.   Baseline:  Goal status: INITIAL  4.  Baseline:   Goal status: INITIAL    LONG TERM GOALS: Target date: 10/04/2024    Increase AROM R knee to 0 to -2 deg extension and 120 deg flexion allowing patient to perform normal transfers  Baseline:  Goal status: INITIAL  2.  Increase strength R LE to 5/5 allowing patient to perform ADL's  Baseline:  Goal status: INITIAL  3.  Independent gait with least restrictive assistive device  Baseline:  Goal status: INITIAL  4.  Patient reports return to walking dog 3-5 days/week for 15-20 min  Baseline:  Goal status: INITIAL  5.  Improve LEFS by >/= 10 points indicating improved ADL's and quality of life  Baseline:  Goal status: INITIAL  6.  Independent in advanced  HEP including aquatic program as indicated  Baseline:  Goal status: INITIAL   PLAN:  PT FREQUENCY: 2x/week  PT DURATION: 8 weeks  PLANNED INTERVENTIONS: 97164- PT Re-evaluation, 97110-Therapeutic exercises, 97530- Therapeutic activity, 97112- Neuromuscular re-education, 97535- Self Care, [id number]- Manual therapy, [phone]- Gait training, [phone]- Aquatic Therapy, [phone]- Ionotophoresis 4mg /ml Dexamethasone, Patient/Family education, Balance training, Stair training, Taping, and Joint mobilization  PLAN FOR NEXT SESSION: review and progress exercises; education re joint conservation and postural  influences in pain; manual work and modalities    Alejandra Barna P Kimorah Ridolfi, PT 07/26/2024, 5:29 PM

## 2024-07-26 ENCOUNTER — Other Ambulatory Visit: Payer: Self-pay

## 2024-07-26 ENCOUNTER — Ambulatory Visit: Attending: Orthopedic Surgery | Admitting: Rehabilitative and Restorative Service Providers"

## 2024-07-26 ENCOUNTER — Encounter: Payer: Self-pay | Admitting: Rehabilitative and Restorative Service Providers"

## 2024-07-26 DIAGNOSIS — M6281 Muscle weakness (generalized): Secondary | ICD-10-CM | POA: Insufficient documentation

## 2024-07-26 DIAGNOSIS — G8929 Other chronic pain: Secondary | ICD-10-CM | POA: Insufficient documentation

## 2024-07-26 DIAGNOSIS — R2689 Other abnormalities of gait and mobility: Secondary | ICD-10-CM | POA: Diagnosis present

## 2024-07-26 DIAGNOSIS — R635 Abnormal weight gain: Secondary | ICD-10-CM | POA: Diagnosis present

## 2024-07-26 DIAGNOSIS — R600 Localized edema: Secondary | ICD-10-CM | POA: Diagnosis present

## 2024-07-26 DIAGNOSIS — M25561 Pain in right knee: Secondary | ICD-10-CM | POA: Diagnosis present

## 2024-07-26 DIAGNOSIS — R29898 Other symptoms and signs involving the musculoskeletal system: Secondary | ICD-10-CM | POA: Diagnosis present

## 2024-07-27 ENCOUNTER — Ambulatory Visit
Admission: RE | Admit: 2024-07-27 | Discharge: 2024-07-27 | Disposition: A | Discharge status: Home / Self Care | Source: Ambulatory Visit | Attending: Internal Medicine | Admitting: Internal Medicine

## 2024-07-27 DIAGNOSIS — I77810 Thoracic aortic ectasia: Secondary | ICD-10-CM

## 2024-07-27 MED ORDER — IOPAMIDOL (ISOVUE-370) INJECTION 76%
75.0000 mL | Freq: Once | INTRAVENOUS | Status: AC | PRN
  Administered 2024-07-27: 75 mL via INTRAVENOUS

## 2024-08-10 NOTE — Therapy (Signed)
 OUTPATIENT PHYSICAL THERAPY LOWER EXTREMITY EVALUATION   Patient Name: MARLENA BARBATO MRN: [id number] DOB:July 05, 1956, 68 y.o., female Today's Date: 08/13/2024  END OF SESSION:  PT End of Session - 08/13/24 1011     Visit Number 1    Number of Visits 17    Date for Recertification  10/08/24    Authorization Type BCBS + medicare    Progress Note Due on Visit 10    PT Start Time 1012    PT Stop Time 1052    PT Time Calculation (min) 40 min          Past Medical History:  Diagnosis Date   Hypertension    Thyroid  cancer (HCC)    Past Surgical History:  Procedure Laterality Date   ABDOMINAL HYSTERECTOMY  1999   ABDOMINAL HYSTERECTOMY     CHOLECYSTECTOMY  03/08/2012   Procedure: LAPAROSCOPIC CHOLECYSTECTOMY WITH INTRAOPERATIVE CHOLANGIOGRAM;  Surgeon: Krystal JINNY Russell, MD;  Location: [facility];  Service: General;  Laterality: N/A;   CHOLECYSTECTOMY     THYROIDECTOMY     TOTAL THYROIDECTOMY  2011, 2012   x2   Patient Active Problem List   Diagnosis Date Noted   Hurthle cell neoplasm of thyroid , minimally invasive 08/30/2011    PCP: Janey Santos, MD   REFERRING PROVIDER: Edna Toribio LABOR, MD   REFERRING DIAG: [phone] (ICD-10-CM) - Presence of right artificial knee joint   THERAPY DIAG:  Right knee pain, unspecified chronicity  Muscle weakness (generalized)  Other abnormalities of gait and mobility  Localized edema  Rationale for Evaluation and Treatment: Rehabilitation  ONSET DATE: R TKA DOS 08/10/24  SUBJECTIVE:   SUBJECTIVE STATEMENT: Pt endorses chronic history of knee pain, retired in May. Tried cortisone injections without much reported relief and elected for R TKA DOS 08/10/24. Has been ambulating with RW since surgery, trying to get up throughout the day. Husband assisting with ADLs. Hasn't been icing much, has done a little bit of her prehab HEP. Difficulty sleeping.   PERTINENT HISTORY: HTN, prior L TKA 2020, hx thyroid  cancer Self reports  chronic tremor, familial  PAIN:  Are you having pain: none Location/description: R knee Best-worst over past week: 0-7/10  - aggravating factors: sleeping, walking, moving  - Easing factors: medication    PRECAUTIONS: none  RED FLAGS: No fevers/chills, no SOB, no calf pain   WEIGHT BEARING RESTRICTIONS: No  FALLS:  Has patient fallen in last 6 months? No  LIVING ENVIRONMENT: Lives w/ spouse in 2 level home, 12 steps inside w/ R rail. 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 Be able to travel with husband when he lorrin QUITTER MD VISIT: Oct 30th   OBJECTIVE:  Note: Objective measures were completed at Evaluation unless otherwise noted.  DIAGNOSTIC FINDINGS:  S/p R TKA DOS 08/10/24  PATIENT SURVEYS:  LEFS: 14/80   COGNITION: Overall cognitive status: Within functional limits for tasks assessed     SENSATION: Has not noticed numbness  EDEMA/INSPECTION:  Incision obscured by bandage and compression stockings - no excessive drainage, no apparent erythema or temperature changes No calf pain with DF or palpation, no excessive warmth  Edema measurement:  RLE tib tub 41.5   LLE tib tub 39    PALPATION:  Gross tenderness about R distal quad  LOWER EXTREMITY ROM:      Right eval Left eval  Hip flexion    Hip extension    Hip internal rotation    Hip external rotation    Knee extension Lacking 12 deg   Knee flexion A: 74 deg P: 80 deg *    (Blank rows = not tested) (Key: WFL = within functional limits not formally assessed, * = concordant pain, s = stiffness/stretching sensation, NT = not tested)  Comments:    LOWER EXTREMITY MMT:    MMT Right eval Left eval  Hip flexion    Hip abduction (modified sitting)    Hip internal  rotation    Hip external rotation    Knee flexion    Knee extension    Ankle dorsiflexion     (Blank rows = not tested) (Key: WFL = within functional limits not formally assessed, * = concordant pain, s = stiffness/stretching sensation, NT = not tested)  Comments: deferred on eval given proximity to surgery   FUNCTIONAL TESTS:  TUG: 28.57sec RW; difficulty with initial ascent for STS, reduced WB through surgical limb   GAIT: Distance walked: within clinic Assistive device utilized: Environmental consultant - 2 wheeled Level of assistance: Modified independence Comments: step to pattern, fwd flexed posture, reduced gait speed/cadence, reduced knee ROM throughout all phases of gait                                                                                                                                TREATMENT:  [facility] Adult PT Treatment:                                                DATE: 08/13/24 Therapeutic Exercise: Quad set, heel slide, seated heel/toe raises practice reps + HEP handout, education/discussion re: modification PRN and rationale for interventions  Self Care: Education/discussion re: post op functional mobility encouraging brief/frequent bouts of walking w/ AD around house; monitoring for signs/symptoms of post op complications and appropriate action should they occur; appropriate edema management strategies   PATIENT EDUCATION:  Education details: Pt education on PT impairments, prognosis, and POC. Informed consent. Rationale for interventions, safe/appropriate HEP performance Person educated: Patient Education method: Explanation, Demonstration, Tactile cues, Verbal cues Education comprehension: verbalized understanding, returned demonstration, verbal cues required, tactile cues required, and needs further education    HOME EXERCISE PROGRAM: Access Code: [email or web address] URL: [email or web address] Date: 08/13/2024 Prepared by: Alm Jenny  Exercises - Seated  Heel Slide  - 3-4 x daily - 1 sets - 8-10 reps - Seated Quad Set  - 3-4 x daily - 1 sets - 8-10 reps - Seated Heel Toe Raises  - 3-4 x daily - 1 sets - 8-10 reps  ASSESSMENT:  CLINICAL IMPRESSION: Patient is a pleasant  68 y.o. woman who was seen today for physical therapy evaluation and treatment for R TKA DOS 08/10/24. Pt endorses difficulty w/ functional mobility and ADLs as expected post op. On exam she demonstrates expected limitations in knee mobility/strength, altered gait/transfer mechanics. Fall risk evidenced by TUG. No concerning features at this time, self care education as above. Tolerates exam/HEP well without adverse event. Recommend skilled PT to address aforementioned deficits with aim of improving functional tolerance and reducing pain with typical activities. Pt departs today's session in no acute distress, all voiced concerns/questions addressed appropriately from PT perspective.      OBJECTIVE IMPAIRMENTS: Abnormal gait, decreased activity tolerance, decreased balance, decreased endurance, decreased mobility, difficulty walking, decreased ROM, decreased strength, hypomobility, increased edema, impaired perceived functional ability, and pain.   ACTIVITY LIMITATIONS: carrying, lifting, standing, squatting, sleeping, stairs, transfers, and locomotion level  PARTICIPATION LIMITATIONS: meal prep, cleaning, laundry, driving, and community activity  PERSONAL FACTORS: Time since onset of injury/illness/exacerbation and 3+ comorbidities: HTN, prior TKA, hx thyroid  cancer are also affecting patient's functional outcome.   REHAB POTENTIAL: Good  CLINICAL DECISION MAKING: Stable/uncomplicated  EVALUATION COMPLEXITY: Low   GOALS:  SHORT TERM GOALS: Target date: 09/10/2024  Pt will demonstrate appropriate understanding and performance of initially prescribed HEP in order to facilitate improved independence with management of symptoms.  Baseline: HEP established  Goal status:  INITIAL   2. Pt will report at least 25% improvement in overall pain levels over past week in order to facilitate improved tolerance to typical daily activities.   Baseline: 0-7/10  Goal status: INITIAL    LONG TERM GOALS: Target date: 10/08/2024   Pt will score 40/80 or greater on LEFS in order to demonstrate improved perception of function due to symptoms (MCID 9 pts) Baseline: 14/80 Goal status: INITIAL  2.  Pt will demonstrate at least 0-110 degrees of knee AROM on surgical limb in order to facilitate improved tolerance to functional movements such as squatting, walking, and stair navigation.  Baseline: see ROM chart above Goal status: INITIAL  3.  Pt will demonstrate appropriate performance of final prescribed HEP in order to facilitate improved self-management of symptoms post-discharge.   Baseline: initial HEP prescribed   Goal status: INITIAL    4.  Pt will be able to perform TUG in less than or equal to 14 sec with LRAD in order to indicate reduced risk of falling (cutoff score for fall risk 13.5 sec in community dwelling older adults per [facility] et al, 2000)  Baseline: 29sec RW  Goal status: INITIAL    5. Pt will report at least 50% decrease in overall pain levels in past week in order to facilitate improved tolerance to basic ADLs/mobility.   Baseline: 0-8/10  Goal status: INITIAL    6. Pt will demonstrate symmetrical knee MMT between surgical and non-surgical limb in order to facilitate improved functional strength and mechanics.  Baseline: deferred on eval given proximity to surgery  Goal status: INITIAL     PLAN:  PT FREQUENCY: 2x/week  PT DURATION: 8 weeks  PLANNED INTERVENTIONS: 97164- PT Re-evaluation, 97750- Physical Performance Testing, 97110-Therapeutic exercises, 97530- Therapeutic activity, [id number]- Neuromuscular re-education, 97535- Self Care, [id number]- Manual therapy, [id number]- Gait training, 97016- Vasopneumatic device, [phone] (1-2 muscles), [phone] (3+  muscles)- Dry Needling, Patient/Family education, Balance training, Stair training, Taping, Joint mobilization, Scar mobilization, Cryotherapy, and Moist heat  PLAN FOR NEXT SESSION: Review/update HEP PRN. Work on [employer] exercises as appropriate with emphasis on knee flex/ext ROM, quad activation, edema management.  Symptom modification strategies as indicated/appropriate. Mindful of cancer hx   Alm DELENA Jenny PT, DPT 08/13/2024 12:37 PM

## 2024-08-13 ENCOUNTER — Encounter: Payer: Self-pay | Admitting: Physical Therapy

## 2024-08-13 ENCOUNTER — Ambulatory Visit: Admitting: Physical Therapy

## 2024-08-13 ENCOUNTER — Other Ambulatory Visit: Payer: Self-pay

## 2024-08-13 DIAGNOSIS — R2689 Other abnormalities of gait and mobility: Secondary | ICD-10-CM

## 2024-08-13 DIAGNOSIS — R600 Localized edema: Secondary | ICD-10-CM

## 2024-08-13 DIAGNOSIS — M6281 Muscle weakness (generalized): Secondary | ICD-10-CM

## 2024-08-13 DIAGNOSIS — M25561 Pain in right knee: Secondary | ICD-10-CM

## 2024-08-16 ENCOUNTER — Encounter: Payer: Self-pay | Admitting: Physical Therapy

## 2024-08-16 ENCOUNTER — Ambulatory Visit: Admitting: Physical Therapy

## 2024-08-16 DIAGNOSIS — M25561 Pain in right knee: Secondary | ICD-10-CM | POA: Diagnosis not present

## 2024-08-16 DIAGNOSIS — R600 Localized edema: Secondary | ICD-10-CM

## 2024-08-16 DIAGNOSIS — M6281 Muscle weakness (generalized): Secondary | ICD-10-CM

## 2024-08-16 DIAGNOSIS — R2689 Other abnormalities of gait and mobility: Secondary | ICD-10-CM

## 2024-08-16 NOTE — Therapy (Signed)
 OUTPATIENT PHYSICAL THERAPY TREATMENT   Patient Name: Yvonne Morris MRN: [id number] DOB:18-May-1956, 68 y.o., female Today's Date: 08/16/2024  END OF SESSION:  PT End of Session - 08/16/24 1013     Visit Number 2    Number of Visits 17    Date for Recertification  10/08/24    Authorization Type BCBS + medicare    Progress Note Due on Visit 10    PT Start Time 1015    PT Stop Time 1057    PT Time Calculation (min) 42 min           Past Medical History:  Diagnosis Date   Hypertension    Thyroid  cancer (HCC)    Past Surgical History:  Procedure Laterality Date   ABDOMINAL HYSTERECTOMY  1999   ABDOMINAL HYSTERECTOMY     CHOLECYSTECTOMY  03/08/2012   Procedure: LAPAROSCOPIC CHOLECYSTECTOMY WITH INTRAOPERATIVE CHOLANGIOGRAM;  Surgeon: Krystal JINNY Russell, MD;  Location: [facility];  Service: General;  Laterality: N/A;   CHOLECYSTECTOMY     THYROIDECTOMY     TOTAL THYROIDECTOMY  2011, 2012   x2   Patient Active Problem List   Diagnosis Date Noted   Hurthle cell neoplasm of thyroid , minimally invasive 08/30/2011    PCP: Janey Santos, MD   REFERRING PROVIDER: Edna Toribio LABOR, MD   REFERRING DIAG: [phone] (ICD-10-CM) - Presence of right artificial knee joint   THERAPY DIAG:  Right knee pain, unspecified chronicity  Muscle weakness (generalized)  Other abnormalities of gait and mobility  Localized edema  Rationale for Evaluation and Treatment: Rehabilitation  ONSET DATE: R TKA DOS 08/10/24  SUBJECTIVE:   Per eval: Pt endorses chronic history of knee pain, retired in May. Tried cortisone injections without much reported relief and elected for R TKA DOS 08/10/24. Has been ambulating with RW since surgery, trying to get up throughout the day. Husband assisting with ADLs. Hasn't been icing much, has done a little bit of her prehab HEP. Difficulty sleeping.   SUBJECTIVE STATEMENT: 08/16/2024: a bit nauseous with pain medication. No pain at present. Has done  HEP without issue. No other new updates   PERTINENT HISTORY: HTN, prior L TKA 2020, hx thyroid  cancer Self reports chronic tremor, familial  PAIN:  Are you having pain: none  Per eval:  Location/description: R knee Best-worst over past week: 0-7/10  - aggravating factors: sleeping, walking, moving  - Easing factors: medication    PRECAUTIONS: none  RED FLAGS: No fevers/chills, no SOB, no calf pain   WEIGHT BEARING RESTRICTIONS: No  FALLS:  Has patient fallen in last 6 months? No  LIVING ENVIRONMENT: Lives w/ spouse in 2 level home, 12 steps inside w/ R rail. 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 Be able to travel with husband when he lorrin QUITTER MD VISIT: Oct 30th   OBJECTIVE:  Note: Objective measures were completed at Evaluation unless otherwise noted.  DIAGNOSTIC FINDINGS:  S/p R TKA DOS 08/10/24  PATIENT SURVEYS:  LEFS: 14/80   COGNITION: Overall cognitive status: Within functional limits for tasks assessed     SENSATION: Has not noticed numbness  EDEMA/INSPECTION:  Incision obscured by bandage and compression stockings - no excessive drainage, no apparent erythema or temperature changes No  calf pain with DF or palpation, no excessive warmth  Edema measurement:  RLE tib tub 41.5   LLE tib tub 39    PALPATION: Gross tenderness about R distal quad  LOWER EXTREMITY ROM:      Right eval Left eval R 08/16/24  Hip flexion     Hip extension     Hip internal rotation     Hip external rotation     Knee extension Lacking 12 deg  Lacking 9 deg  Knee flexion A: 74 deg P: 80 deg *     (Blank rows = not tested) (Key: WFL = within functional limits not formally assessed, * = concordant pain, s = stiffness/stretching  sensation, NT = not tested)  Comments:    LOWER EXTREMITY MMT:    MMT Right eval Left eval  Hip flexion    Hip abduction (modified sitting)    Hip internal rotation    Hip external rotation    Knee flexion    Knee extension    Ankle dorsiflexion     (Blank rows = not tested) (Key: WFL = within functional limits not formally assessed, * = concordant pain, s = stiffness/stretching sensation, NT = not tested)  Comments: deferred on eval given proximity to surgery   FUNCTIONAL TESTS:  TUG: 28.57sec RW; difficulty with initial ascent for STS, reduced WB through surgical limb   GAIT: Distance walked: within clinic Assistive device utilized: Environmental consultant - 2 wheeled Level of assistance: Modified independence Comments: step to pattern, fwd flexed posture, reduced gait speed/cadence, reduced knee ROM throughout all phases of gait                                                                                                                                TREATMENT:   [facility] Adult PT Treatment:                                                DATE: 08/16/24 Therapeutic Exercise: Seated hamstring/calf stretch w/ strap and stool 3x30sec  Seated heel slides w/ strap and pillow case x12 Standing heel raises x10 w RW cues for pacing Unresisted LAQ x10 RLE only HEP update + education/handout   Neuromuscular re-ed: Propped quat set w/ stool 2x10 tactile/verbal cues  Fwd/retro stepping RLE stance w/ RW x15 total cues for closed chain stability and weight shifting   Self Care: RW adjustment (posture), continued RW use, gradual progression of activity and pacing of tasks to mitigate quad fatigue, appropriate icing regimen, continued frequent/short bouts of mobility throughout day   [facility] Adult PT Treatment:  DATE: 08/13/24 Therapeutic Exercise: Quad set, heel slide, seated heel/toe raises practice reps + HEP handout, education/discussion re:  modification PRN and rationale for interventions  Self Care: Education/discussion re: post op functional mobility encouraging brief/frequent bouts of walking w/ AD around house; monitoring for signs/symptoms of post op complications and appropriate action should they occur; appropriate edema management strategies   PATIENT EDUCATION:  Education details: rationale for interventions, HEP  Person educated: Patient Education method: Explanation, Demonstration, Tactile cues, Verbal cues Education comprehension: verbalized understanding, returned demonstration, verbal cues required, tactile cues required, and needs further education     HOME EXERCISE PROGRAM: Access Code: [email or web address] URL: [email or web address] Date: 08/16/2024 Prepared by: Alm Jenny  Exercises - Seated Heel Slide  - 3-4 x daily - 1 sets - 8-10 reps - Seated Quad Set  - 3-4 x daily - 1 sets - 8-10 reps - Seated Hamstring Stretch with Strap  - 3-4 x daily - 1 sets - 3 reps - Seated Heel Toe Raises  - 3-4 x daily - 1 sets - 8-10 reps - Heel Raises with Counter Support  - 3-4 x daily - 1 sets - 8 reps  ASSESSMENT:  CLINICAL IMPRESSION: 08/16/2024: Pt arrives w/o pain, mild nausea she attributes to pain medication, no issues since initial eval. Today continuing to build program working on knee mobility and quad activation. Self care education as above. No adverse events, tolerates session well without increase in pain. One instance of knee buckling w/ fwd retro stepping d/t quad fatigue but recovers well with rest - encouraged pacing of tasks, continued RW use for safety. Reports feeling much improved on departure compared to arrival. Recommend continuing along current POC in order to address relevant deficits and improve functional tolerance. Pt departs today's session in no acute distress, all voiced questions/concerns addressed appropriately from PT perspective.    Per eval: Patient is a pleasant 68 y.o. woman  who was seen today for physical therapy evaluation and treatment for R TKA DOS 08/10/24. Pt endorses difficulty w/ functional mobility and ADLs as expected post op. On exam she demonstrates expected limitations in knee mobility/strength, altered gait/transfer mechanics. Fall risk evidenced by TUG. No concerning features at this time, self care education as above. Tolerates exam/HEP well without adverse event. Recommend skilled PT to address aforementioned deficits with aim of improving functional tolerance and reducing pain with typical activities. Pt departs today's session in no acute distress, all voiced concerns/questions addressed appropriately from PT perspective.      OBJECTIVE IMPAIRMENTS: Abnormal gait, decreased activity tolerance, decreased balance, decreased endurance, decreased mobility, difficulty walking, decreased ROM, decreased strength, hypomobility, increased edema, impaired perceived functional ability, and pain.   ACTIVITY LIMITATIONS: carrying, lifting, standing, squatting, sleeping, stairs, transfers, and locomotion level  PARTICIPATION LIMITATIONS: meal prep, cleaning, laundry, driving, and community activity  PERSONAL FACTORS: Time since onset of injury/illness/exacerbation and 3+ comorbidities: HTN, prior TKA, hx thyroid  cancer are also affecting patient's functional outcome.   REHAB POTENTIAL: Good  CLINICAL DECISION MAKING: Stable/uncomplicated  EVALUATION COMPLEXITY: Low   GOALS:  SHORT TERM GOALS: Target date: 09/10/2024  Pt will demonstrate appropriate understanding and performance of initially prescribed HEP in order to facilitate improved independence with management of symptoms.  Baseline: HEP established  Goal status: INITIAL   2. Pt will report at least 25% improvement in overall pain levels over past week in order to facilitate improved tolerance to typical daily activities.   Baseline: 0-7/10  Goal status: INITIAL    LONG TERM GOALS:  Target date:  10/08/2024   Pt will score 40/80 or greater on LEFS in order to demonstrate improved perception of function due to symptoms (MCID 9 pts) Baseline: 14/80 Goal status: INITIAL  2.  Pt will demonstrate at least 0-110 degrees of knee AROM on surgical limb in order to facilitate improved tolerance to functional movements such as squatting, walking, and stair navigation.  Baseline: see ROM chart above Goal status: INITIAL  3.  Pt will demonstrate appropriate performance of final prescribed HEP in order to facilitate improved self-management of symptoms post-discharge.   Baseline: initial HEP prescribed   Goal status: INITIAL    4.  Pt will be able to perform TUG in less than or equal to 14 sec with LRAD in order to indicate reduced risk of falling (cutoff score for fall risk 13.5 sec in community dwelling older adults per [facility] et al, 2000)  Baseline: 29sec RW  Goal status: INITIAL    5. Pt will report at least 50% decrease in overall pain levels in past week in order to facilitate improved tolerance to basic ADLs/mobility.   Baseline: 0-8/10  Goal status: INITIAL    6. Pt will demonstrate symmetrical knee MMT between surgical and non-surgical limb in order to facilitate improved functional strength and mechanics.  Baseline: deferred on eval given proximity to surgery  Goal status: INITIAL     PLAN:  PT FREQUENCY: 2x/week  PT DURATION: 8 weeks  PLANNED INTERVENTIONS: 97164- PT Re-evaluation, 97750- Physical Performance Testing, 97110-Therapeutic exercises, 97530- Therapeutic activity, [id number]- Neuromuscular re-education, 97535- Self Care, 02859- Manual therapy, [id number]- Gait training, 97016- Vasopneumatic device, [phone] (1-2 muscles), 20561 (3+ muscles)- Dry Needling, Patient/Family education, Balance training, Stair training, Taping, Joint mobilization, Scar mobilization, Cryotherapy, and Moist heat  PLAN FOR NEXT SESSION: Review/update HEP PRN. Work on [employer] exercises as  appropriate with emphasis on knee flex/ext ROM, quad activation, edema management. Symptom modification strategies as indicated/appropriate. Mindful of cancer hx   Alm DELENA Jenny PT, DPT 08/16/2024 11:02 AM

## 2024-08-20 ENCOUNTER — Ambulatory Visit: Admitting: Physical Therapy

## 2024-08-20 ENCOUNTER — Encounter: Payer: Self-pay | Admitting: Physical Therapy

## 2024-08-20 DIAGNOSIS — R600 Localized edema: Secondary | ICD-10-CM

## 2024-08-20 DIAGNOSIS — R2689 Other abnormalities of gait and mobility: Secondary | ICD-10-CM

## 2024-08-20 DIAGNOSIS — M25561 Pain in right knee: Secondary | ICD-10-CM

## 2024-08-20 DIAGNOSIS — M6281 Muscle weakness (generalized): Secondary | ICD-10-CM

## 2024-08-20 NOTE — Therapy (Signed)
 OUTPATIENT PHYSICAL THERAPY TREATMENT   Patient Name: Yvonne Morris MRN: [id number] DOB:[birth year], 68 y.o., female Today's Date: 08/20/2024  END OF SESSION:  PT End of Session - 08/20/24 1018     Visit Number 3    Number of Visits 17    Date for Recertification  10/08/24    Authorization Type [employer] + medicare    Progress Note Due on Visit 10    PT Start Time 1018    PT Stop Time 1100    PT Time Calculation (min) 42 min            Past Medical History:  Diagnosis Date   Hypertension    Thyroid  cancer (HCC)    Past Surgical History:  Procedure Laterality Date   ABDOMINAL HYSTERECTOMY  1999   ABDOMINAL HYSTERECTOMY     CHOLECYSTECTOMY  03/08/2012   Procedure: LAPAROSCOPIC CHOLECYSTECTOMY WITH INTRAOPERATIVE CHOLANGIOGRAM;  Surgeon: Krystal JINNY Russell, MD;  Location: [facility];  Service: General;  Laterality: N/A;   CHOLECYSTECTOMY     THYROIDECTOMY     TOTAL THYROIDECTOMY  2011, 2012   x2   Patient Active Problem List   Diagnosis Date Noted   Hurthle cell neoplasm of thyroid , minimally invasive 08/30/2011    PCP: Janey Santos, MD   REFERRING PROVIDER: Edna Toribio LABOR, MD   REFERRING DIAG: [phone] (ICD-10-CM) - Presence of right artificial knee joint   THERAPY DIAG:  Right knee pain, unspecified chronicity  Muscle weakness (generalized)  Other abnormalities of gait and mobility  Localized edema  Rationale for Evaluation and Treatment: Rehabilitation  ONSET DATE: R TKA DOS 08/10/24  SUBJECTIVE:   Per eval: Pt endorses chronic history of knee pain, retired in May. Tried cortisone injections without much reported relief and elected for R TKA DOS 08/10/24. Has been ambulating with RW since surgery, trying to get up throughout the day. Husband assisting with ADLs. Hasn't been icing much, has done a little bit of her prehab HEP. Difficulty sleeping.   SUBJECTIVE STATEMENT: 08/20/2024: having difficulty w/ sleep positioning. Felt good after last  session. Has been icing a little bit. No pain at present. No other new updates. 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 No  LIVING ENVIRONMENT: Lives w/ spouse in 2 level home, 12 steps inside w/ R rail. 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 Be able to travel with husband when he lorrin QUITTER MD VISIT: Oct 30th   OBJECTIVE:  Note: Objective measures were completed at Evaluation unless otherwise noted.  DIAGNOSTIC FINDINGS:  S/p R TKA DOS 08/10/24  PATIENT SURVEYS:  LEFS: 14/80   COGNITION: Overall cognitive status: Within functional limits for tasks assessed     SENSATION: Has not noticed numbness  EDEMA/INSPECTION:  Incision obscured by bandage and compression stockings - no excessive  drainage, no apparent erythema or temperature changes No calf pain with DF or palpation, no excessive warmth  Edema measurement:  RLE tib tub 41.5   LLE tib tub 39    PALPATION: Gross tenderness about R distal quad  LOWER EXTREMITY ROM:      Right eval Left eval R 08/16/24 R 08/20/24  Hip flexion      Hip extension      Hip internal rotation      Hip external rotation      Knee extension Lacking 12 deg  Lacking 9 deg Lacking 3-4 deg w/ heel prop    Knee flexion A: 74 deg P: 80 deg *    A: 100 deg  AA: 104 deg    (Blank rows = not tested) (Key: WFL = within functional limits not formally assessed, * = concordant pain, s = stiffness/stretching sensation, NT = not tested)  Comments:    LOWER EXTREMITY MMT:    MMT Right eval Left eval  Hip flexion    Hip abduction (modified sitting)    Hip internal rotation    Hip external rotation    Knee flexion    Knee extension    Ankle dorsiflexion     (Blank rows = not tested) (Key: WFL = within functional limits not formally assessed, * = concordant pain, s = stiffness/stretching sensation, NT = not tested)  Comments: deferred on eval given proximity to surgery   FUNCTIONAL TESTS:  TUG: 28.57sec RW; difficulty with initial ascent for STS, reduced WB through surgical limb   GAIT: Distance walked: within clinic Assistive device utilized: [employer] - 2 wheeled Level of assistance: Modified independence Comments: step to pattern, fwd flexed posture, reduced gait speed/cadence, reduced knee ROM throughout all phases of gait                                                                                                                                TREATMENT:   [facility] Adult PT Treatment:                                                DATE: 08/20/24 Therapeutic Exercise: Seated heel slides w/ strap and pillow case x15  Propped quad set + calf stretch 2x10; 5sec hold  ROM + education HEP education/discussion  Neuromuscular re-ed: 2 inch fwd step up w BIL UE support 2x10; emphasis on closed chain quad activation  [employer] taps, alternating x10 BIL cues for posture and sagittal plane activation  Bosu TKE push 2x10 RLE cues for quad activation  Self Care: Education/discussion re: appropriate positioning, icing regimen, continuing to monitor surgical site, activity modification based on symptom response     [facility] Adult PT Treatment:  DATE: 08/16/24 Therapeutic Exercise: Seated hamstring/calf stretch w/  strap and stool 3x30sec  Seated heel slides w/ strap and pillow case x12 Standing heel raises x10 w RW cues for pacing Unresisted LAQ x10 RLE only HEP update + education/handout   Neuromuscular re-ed: Propped quat set w/ stool 2x10 tactile/verbal cues  Fwd/retro stepping RLE stance w/ RW x15 total cues for closed chain stability and weight shifting   Self Care: RW adjustment (posture), continued RW use, gradual progression of activity and pacing of tasks to mitigate quad fatigue, appropriate icing regimen, continued frequent/short bouts of mobility throughout day    PATIENT EDUCATION:  Education details: rationale for interventions, HEP  Person educated: Patient Education method: Explanation, Demonstration, Tactile cues, Verbal cues Education comprehension: verbalized understanding, returned demonstration, verbal cues required, tactile cues required, and needs further education     HOME EXERCISE PROGRAM: Access Code: [email or web address] URL: [email or web address] Date: 08/16/2024 Prepared by: Alm Jenny  Exercises - Seated Heel Slide  - 3-4 x daily - 1 sets - 8-10 reps - Seated Quad Set  - 3-4 x daily - 1 sets - 8-10 reps - Seated Hamstring Stretch with Strap  - 3-4 x daily - 1 sets - 3 reps - Seated Heel Toe Raises  - 3-4 x daily - 1 sets - 8-10 reps - Heel Raises with Counter Support  - 3-4 x daily - 1 sets - 8 reps  ASSESSMENT:  CLINICAL IMPRESSION: 08/20/2024: Pt arrives w/o pain, good response to last session and no new concerns. ROM with excellent improvements compared to initial eval. Today continuing to address expected post op deficits in knee mobility and quad activation, introducing TKE push and mini step ups. Noted improved quad activation with closed chain tasks relative to open chain although does well with both. No adverse events, reports no pain and improved stiffness on departure. Continuing self care education as above. Recommend continuing along current  POC in order to address relevant deficits and improve functional tolerance. Pt departs today's session in no acute distress, all voiced questions/concerns addressed appropriately from PT perspective.     Per eval: Patient is a pleasant 68 y.o. woman who was seen today for physical therapy evaluation and treatment for R TKA DOS 08/10/24. Pt endorses difficulty w/ functional mobility and ADLs as expected post op. On exam she demonstrates expected limitations in knee mobility/strength, altered gait/transfer mechanics. Fall risk evidenced by TUG. No concerning features at this time, self care education as above. Tolerates exam/HEP well without adverse event. Recommend skilled PT to address aforementioned deficits with aim of improving functional tolerance and reducing pain with typical activities. Pt departs today's session in no acute distress, all voiced concerns/questions addressed appropriately from PT perspective.      OBJECTIVE IMPAIRMENTS: Abnormal gait, decreased activity tolerance, decreased balance, decreased endurance, decreased mobility, difficulty walking, decreased ROM, decreased strength, hypomobility, increased edema, impaired perceived functional ability, and pain.   ACTIVITY LIMITATIONS: carrying, lifting, standing, squatting, sleeping, stairs, transfers, and locomotion level  PARTICIPATION LIMITATIONS: meal prep, cleaning, laundry, driving, and community activity  PERSONAL FACTORS: Time since onset of injury/illness/exacerbation and 3+ comorbidities: HTN, prior TKA, hx thyroid  cancer are also affecting patient's functional outcome.   REHAB POTENTIAL: Good  CLINICAL DECISION MAKING: Stable/uncomplicated  EVALUATION COMPLEXITY: Low   GOALS:  SHORT TERM GOALS: Target date: 09/10/2024  Pt will demonstrate appropriate understanding and performance of initially prescribed HEP in order to facilitate improved independence with management of symptoms.  Baseline: HEP established  Goal  status:  INITIAL   2. Pt will report at least 25% improvement in overall pain levels over past week in order to facilitate improved tolerance to typical daily activities.   Baseline: 0-7/10  Goal status: INITIAL    LONG TERM GOALS: Target date: 10/08/2024   Pt will score 40/80 or greater on LEFS in order to demonstrate improved perception of function due to symptoms (MCID 9 pts) Baseline: 14/80 Goal status: INITIAL  2.  Pt will demonstrate at least 0-110 degrees of knee AROM on surgical limb in order to facilitate improved tolerance to functional movements such as squatting, walking, and stair navigation.  Baseline: see ROM chart above Goal status: INITIAL  3.  Pt will demonstrate appropriate performance of final prescribed HEP in order to facilitate improved self-management of symptoms post-discharge.   Baseline: initial HEP prescribed   Goal status: INITIAL    4.  Pt will be able to perform TUG in less than or equal to 14 sec with LRAD in order to indicate reduced risk of falling (cutoff score for fall risk 13.5 sec in community dwelling older adults per [facility] et al, 2000)  Baseline: 29sec RW  Goal status: INITIAL    5. Pt will report at least 50% decrease in overall pain levels in past week in order to facilitate improved tolerance to basic ADLs/mobility.   Baseline: 0-8/10  Goal status: INITIAL    6. Pt will demonstrate symmetrical knee MMT between surgical and non-surgical limb in order to facilitate improved functional strength and mechanics.  Baseline: deferred on eval given proximity to surgery  Goal status: INITIAL     PLAN:  PT FREQUENCY: 2x/week  PT DURATION: 8 weeks  PLANNED INTERVENTIONS: 97164- PT Re-evaluation, 97750- Physical Performance Testing, 97110-Therapeutic exercises, 97530- Therapeutic activity, [id number]- Neuromuscular re-education, 97535- Self Care, [id number]- Manual therapy, [id number]- Gait training, 97016- Vasopneumatic device, [phone] (1-2 muscles), [id number]  (3+ muscles)- Dry Needling, Patient/Family education, Balance training, Stair training, Taping, Joint mobilization, Scar mobilization, Cryotherapy, and Moist heat  PLAN FOR NEXT SESSION: Review/update HEP PRN. Work on [employer] exercises as appropriate with emphasis on knee flex/ext ROM, quad activation, edema management. Symptom modification strategies as indicated/appropriate. Mindful of cancer hx   Alm DELENA Jenny PT, DPT 08/20/2024 11:54 AM

## 2024-08-22 ENCOUNTER — Ambulatory Visit

## 2024-08-22 DIAGNOSIS — M6281 Muscle weakness (generalized): Secondary | ICD-10-CM

## 2024-08-22 DIAGNOSIS — R600 Localized edema: Secondary | ICD-10-CM

## 2024-08-22 DIAGNOSIS — R2689 Other abnormalities of gait and mobility: Secondary | ICD-10-CM

## 2024-08-22 DIAGNOSIS — M25561 Pain in right knee: Secondary | ICD-10-CM | POA: Diagnosis not present

## 2024-08-22 DIAGNOSIS — G8929 Other chronic pain: Secondary | ICD-10-CM

## 2024-08-22 NOTE — Therapy (Signed)
 OUTPATIENT PHYSICAL THERAPY TREATMENT   Patient Name: Yvonne Morris MRN: [id number] DOB:[birth year], 68 y.o., female Today's Date: 08/22/2024  END OF SESSION:  PT End of Session - 08/22/24 1017     Visit Number 4    Number of Visits 17    Date for Recertification  10/08/24    Authorization Type [employer] + medicare    Progress Note Due on Visit 10    PT Start Time 1017    PT Stop Time 1100    PT Time Calculation (min) 43 min    Activity Tolerance Patient tolerated treatment well    Behavior During Therapy WFL for tasks assessed/performed         Past Medical History:  Diagnosis Date   Hypertension    Thyroid  cancer (HCC)    Past Surgical History:  Procedure Laterality Date   ABDOMINAL HYSTERECTOMY  1999   ABDOMINAL HYSTERECTOMY     CHOLECYSTECTOMY  03/08/2012   Procedure: LAPAROSCOPIC CHOLECYSTECTOMY WITH INTRAOPERATIVE CHOLANGIOGRAM;  Surgeon: Krystal JINNY Russell, MD;  Location: [facility];  Service: General;  Laterality: N/A;   CHOLECYSTECTOMY     THYROIDECTOMY     TOTAL THYROIDECTOMY  2011, 2012   x2   Patient Active Problem List   Diagnosis Date Noted   Hurthle cell neoplasm of thyroid , minimally invasive 08/30/2011    PCP: Janey Santos, MD   REFERRING PROVIDER: Edna Toribio LABOR, MD   REFERRING DIAG: [phone] (ICD-10-CM) - Presence of right artificial knee joint   THERAPY DIAG:  Right knee pain, unspecified chronicity  Muscle weakness (generalized)  Other abnormalities of gait and mobility  Localized edema  Chronic pain of right knee  Rationale for Evaluation and Treatment: Rehabilitation  ONSET DATE: R TKA DOS 08/10/24  SUBJECTIVE:   Per eval: Pt endorses chronic history of knee pain, retired in May. Tried cortisone injections without much reported relief and elected for R TKA DOS 08/10/24. Has been ambulating with RW since surgery, trying to get up throughout the day. Husband assisting with ADLs. Hasn't been icing much, has done a little bit of  her prehab HEP. Difficulty sleeping.   SUBJECTIVE STATEMENT: Patient reports knee is very stiff today, states it is worse in the mornings but loosens up as day progresses; states she has minimal pain, main compliant is stiffness. Patient states she sees careers adviser tomorrow and will have bandage removed.    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 No  LIVING ENVIRONMENT: Lives w/ spouse in 2 level home, 12 steps inside w/ R rail. 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 Be able to travel with husband when he lorrin QUITTER MD VISIT: Oct 30th   OBJECTIVE:  Note: Objective measures were completed at Evaluation unless otherwise noted.  DIAGNOSTIC FINDINGS:  S/p R TKA DOS 08/10/24  PATIENT SURVEYS:  LEFS: 14/80   COGNITION: Overall cognitive status: Within functional limits for tasks assessed     SENSATION: Has not noticed numbness  EDEMA/INSPECTION:  Incision obscured by bandage and compression stockings - no excessive drainage, no apparent erythema or temperature changes No calf pain with DF or palpation, no excessive warmth  Edema measurement:  RLE tib tub 41.5   LLE tib tub 39    PALPATION: Gross tenderness about R distal quad  LOWER EXTREMITY ROM:      Right eval Left eval R 08/16/24 R 08/20/24  Hip flexion      Hip extension       Hip internal rotation      Hip external rotation      Knee extension Lacking 12 deg  Lacking 9 deg Lacking 3-4 deg w/ heel prop    Knee flexion A: 74 deg P: 80 deg *    A: 100 deg  AA: 104 deg   (Blank rows = not tested) (Key: WFL = within functional limits not formally assessed, * = concordant pain, s = stiffness/stretching sensation, NT = not tested)  Comments:    LOWER EXTREMITY MMT:    MMT Right eval Left eval  Hip flexion    Hip abduction (modified sitting)    Hip internal rotation    Hip external rotation    Knee flexion    Knee extension    Ankle dorsiflexion     (Blank rows = not tested) (Key: WFL = within functional limits not formally assessed, * = concordant pain, s = stiffness/stretching sensation, NT = not tested)  Comments: deferred on eval given proximity to surgery   FUNCTIONAL TESTS:  TUG: 28.57sec RW; difficulty with initial ascent for STS, reduced WB through surgical limb   GAIT: Distance walked: within clinic Assistive device utilized: [employer] - 2 wheeled Level of assistance: Modified independence Comments: step to pattern, fwd flexed posture, reduced gait speed/cadence, reduced knee ROM throughout all phases of gait                                                                                                                                TREATMENT:   [facility] Adult PT Treatment:                                                DATE: 08/22/2024 Therapeutic Exercise: Supine HS/calf stretch with strap  Supine ITB stretch with strap Neuromuscular re-ed: Seated: Quad set 10 x 5 sec Small range SLR 10 x 3 sec Bosu TKE push 2x10 for quad activation  Therapeutic Activity: NuStep L3 x 6 min --> gradually increasing knee flexion mobility as tolerated Seated heel slides for knee flexion AROM 10 x 5 sec Gait training with RW --> focusing on heel strike    [facility] Adult PT  Treatment:                                                DATE:  08/20/24 Therapeutic Exercise: Seated heel slides w/ strap and pillow case x15  Propped quad set + calf stretch 2x10; 5sec hold  ROM + education HEP education/discussion  Neuromuscular re-ed: 2 inch fwd step up w BIL UE support 2x10; emphasis on closed chain quad activation  [employer]one taps, alternating x10 BIL cues for posture and sagittal plane activation  Bosu TKE push 2x10 RLE cues for quad activation  Self Care: Education/discussion re: appropriate positioning, icing regimen, continuing to monitor surgical site, activity modification based on symptom response    [facility] Adult PT Treatment:                                                DATE: 08/16/24 Therapeutic Exercise: Seated hamstring/calf stretch w/ strap and stool 3x30sec  Seated heel slides w/ strap and pillow case x12 Standing heel raises x10 w RW cues for pacing Unresisted LAQ x10 RLE only HEP update + education/handout   Neuromuscular re-ed: Propped quat set w/ stool 2x10 tactile/verbal cues  Fwd/retro stepping RLE stance w/ RW x15 total cues for closed chain stability and weight shifting   Self Care: RW adjustment (posture), continued RW use, gradual progression of activity and pacing of tasks to mitigate quad fatigue, appropriate icing regimen, continued frequent/short bouts of mobility throughout day    PATIENT EDUCATION:  Education details: rationale for interventions, HEP  Person educated: Patient Education method: Explanation, Demonstration, Tactile cues, Verbal cues Education comprehension: verbalized understanding, returned demonstration, verbal cues required, tactile cues required, and needs further education     HOME EXERCISE PROGRAM: Access Code: [email or web address] URL: [email or web address] Date: 08/16/2024 Prepared by: Alm Jenny  Exercises - Seated Heel Slide  - 3-4 x daily - 1 sets - 8-10 reps - Seated Quad Set  - 3-4 x daily - 1 sets - 8-10 reps - Seated Hamstring Stretch with Strap  - 3-4  x daily - 1 sets - 3 reps - Seated Heel Toe Raises  - 3-4 x daily - 1 sets - 8-10 reps - Heel Raises with Counter Support  - 3-4 x daily - 1 sets - 8 reps  ASSESSMENT:  CLINICAL IMPRESSION:  NuStep incorporated at start if session to progress knee flexion mobility; patient tolerated well and reported significant decrease in stiffness. Continued quad activation exercises and progressing knee flexion/extension AROM. Gait training focused on heel strike and functional knee flexion on swing phase; adjusted RW to proper height and alleviated shoulder/postural tension.  Per eval: Patient is a pleasant 68 y.o. woman who was seen today for physical therapy evaluation and treatment for R TKA DOS 08/10/24. Pt endorses difficulty w/ functional mobility and ADLs as expected post op. On exam she demonstrates expected limitations in knee mobility/strength, altered gait/transfer mechanics. Fall risk evidenced by TUG. No concerning features at this time, self care education as above. Tolerates exam/HEP well without adverse event. Recommend skilled PT to address aforementioned deficits with aim of improving functional tolerance and reducing pain with typical activities. Pt departs today's session in no acute distress, all voiced concerns/questions addressed appropriately from PT perspective.  OBJECTIVE IMPAIRMENTS: Abnormal gait, decreased activity tolerance, decreased balance, decreased endurance, decreased mobility, difficulty walking, decreased ROM, decreased strength, hypomobility, increased edema, impaired perceived functional ability, and pain.   ACTIVITY LIMITATIONS: carrying, lifting, standing, squatting, sleeping, stairs, transfers, and locomotion level  PARTICIPATION LIMITATIONS: meal prep, cleaning, laundry, driving, and community activity  PERSONAL FACTORS: Time since onset of injury/illness/exacerbation and 3+ comorbidities: HTN, prior TKA, hx thyroid  cancer are also affecting patient's functional  outcome.   REHAB POTENTIAL: Good  CLINICAL DECISION MAKING: Stable/uncomplicated  EVALUATION COMPLEXITY: Low   GOALS:  SHORT TERM GOALS: Target date: 09/10/2024  Pt will demonstrate appropriate understanding and performance of initially prescribed HEP in order to facilitate improved independence with management of symptoms.  Baseline: HEP established  Goal status: INITIAL   2. Pt will report at least 25% improvement in overall pain levels over past week in order to facilitate improved tolerance to typical daily activities.   Baseline: 0-7/10  Goal status: INITIAL    LONG TERM GOALS: Target date: 10/08/2024   Pt will score 40/80 or greater on LEFS in order to demonstrate improved perception of function due to symptoms (MCID 9 pts) Baseline: 14/80 Goal status: INITIAL  2.  Pt will demonstrate at least 0-110 degrees of knee AROM on surgical limb in order to facilitate improved tolerance to functional movements such as squatting, walking, and stair navigation.  Baseline: see ROM chart above Goal status: INITIAL  3.  Pt will demonstrate appropriate performance of final prescribed HEP in order to facilitate improved self-management of symptoms post-discharge.   Baseline: initial HEP prescribed   Goal status: INITIAL    4.  Pt will be able to perform TUG in less than or equal to 14 sec with LRAD in order to indicate reduced risk of falling (cutoff score for fall risk 13.5 sec in community dwelling older adults per [facility] et al, 2000)  Baseline: 29sec RW  Goal status: INITIAL    5. Pt will report at least 50% decrease in overall pain levels in past week in order to facilitate improved tolerance to basic ADLs/mobility.   Baseline: 0-8/10  Goal status: INITIAL    6. Pt will demonstrate symmetrical knee MMT between surgical and non-surgical limb in order to facilitate improved functional strength and mechanics.  Baseline: deferred on eval given proximity to surgery  Goal  status: INITIAL     PLAN:  PT FREQUENCY: 2x/week  PT DURATION: 8 weeks  PLANNED INTERVENTIONS: 97164- PT Re-evaluation, 97750- Physical Performance Testing, 97110-Therapeutic exercises, 97530- Therapeutic activity, [id number]- Neuromuscular re-education, 97535- Self Care, [id number]- Manual therapy, [id number]- Gait training, [phone]- Vasopneumatic device, [phone] (1-2 muscles), [id number] (3+ muscles)- Dry Needling, Patient/Family education, Balance training, Stair training, Taping, Joint mobilization, Scar mobilization, Cryotherapy, and Moist heat  PLAN FOR NEXT SESSION: Follow up on surgeon follow-up; Try vaso next visit? Review/update HEP PRN. Work on [employer] exercises as appropriate with emphasis on knee flex/ext ROM, quad activation, edema management. Symptom modification strategies as indicated/appropriate. Mindful of cancer hx   Lamarr Price, PTA 08/22/2024 12:35 PM

## 2024-08-23 ENCOUNTER — Encounter: Admitting: Physical Therapy

## 2024-08-27 ENCOUNTER — Ambulatory Visit: Attending: Orthopedic Surgery | Admitting: Rehabilitative and Restorative Service Providers"

## 2024-08-27 ENCOUNTER — Encounter: Payer: Self-pay | Admitting: Rehabilitative and Restorative Service Providers"

## 2024-08-27 DIAGNOSIS — M25561 Pain in right knee: Secondary | ICD-10-CM | POA: Insufficient documentation

## 2024-08-27 DIAGNOSIS — R600 Localized edema: Secondary | ICD-10-CM | POA: Diagnosis present

## 2024-08-27 DIAGNOSIS — M6281 Muscle weakness (generalized): Secondary | ICD-10-CM | POA: Diagnosis present

## 2024-08-27 DIAGNOSIS — R2689 Other abnormalities of gait and mobility: Secondary | ICD-10-CM | POA: Diagnosis present

## 2024-08-27 NOTE — Therapy (Signed)
 OUTPATIENT PHYSICAL THERAPY TREATMENT   Patient Name: Yvonne Morris MRN: [id number] DOB:[birth year], 68 y.o., female Today's Date: 08/27/2024  END OF SESSION:  PT End of Session - 08/27/24 0935     Visit Number 5    Number of Visits 17    Date for Recertification  10/08/24    Authorization Type [employer] + medicare    Progress Note Due on Visit 10    PT Start Time 0933    PT Stop Time 1011    PT Time Calculation (min) 38 min    Activity Tolerance Patient tolerated treatment well    Behavior During Therapy [facility] for tasks assessed/performed          Past Medical History:  Diagnosis Date   Hypertension    Thyroid  cancer [facility])    Past Surgical History:  Procedure Laterality Date   ABDOMINAL HYSTERECTOMY  1999   ABDOMINAL HYSTERECTOMY     CHOLECYSTECTOMY  03/08/2012   Procedure: LAPAROSCOPIC CHOLECYSTECTOMY WITH INTRAOPERATIVE CHOLANGIOGRAM;  Surgeon: Krystal JINNY Russell, MD;  Location: [facility];  Service: General;  Laterality: N/A;   CHOLECYSTECTOMY     THYROIDECTOMY     TOTAL THYROIDECTOMY  2011, 2012   x2   Patient Active Problem List   Diagnosis Date Noted   Hurthle cell neoplasm of thyroid , minimally invasive 08/30/2011    PCP: Janey Santos, MD   REFERRING PROVIDER: Edna Toribio LABOR, MD   REFERRING DIAG: [phone] (ICD-10-CM) - Presence of right artificial knee joint   THERAPY DIAG:  Right knee pain, unspecified chronicity  Muscle weakness (generalized)  Other abnormalities of gait and mobility  Localized edema  Rationale for Evaluation and Treatment: Rehabilitation  ONSET DATE: R TKA DOS 08/10/24  SUBJECTIVE:   Per eval: Pt endorses chronic history of knee pain, retired in May. Tried cortisone injections without much reported relief and elected for R TKA DOS 08/10/24. Has been ambulating with RW since surgery, trying to get up throughout the day. Husband assisting with ADLs. Hasn't been icing much, has done a little bit of her prehab HEP. Difficulty  sleeping.   SUBJECTIVE STATEMENT: The patient reports no pain at rest. She is doing HEP and feels stiffness is improving. She uses ice 2x/day.  She saw surgeon last week with no changes in plan reported.   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 No  LIVING ENVIRONMENT: Lives w/ spouse in 2 level home, 12 steps inside w/ R rail. 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 Be able to travel with husband when he lorrin QUITTER MD VISIT: Oct 30th   OBJECTIVE:  Note: Objective measures were completed at Evaluation unless otherwise noted.  DIAGNOSTIC FINDINGS:  S/p R TKA DOS 08/10/24  PATIENT SURVEYS:  LEFS: 14/80   COGNITION: Overall cognitive status: Within functional limits for tasks assessed  SENSATION: Has not noticed numbness  EDEMA/INSPECTION:  Incision obscured by bandage and compression stockings - no excessive drainage, no apparent erythema or temperature changes No calf pain with DF or palpation, no excessive warmth  Edema measurement:  RLE tib tub 41.5   LLE tib tub 39    PALPATION: Gross tenderness about R distal quad  LOWER EXTREMITY ROM:     Right eval Left eval R 08/16/24 R 08/20/24 R 08/27/24  Hip flexion       Hip extension       Hip internal rotation       Hip external rotation       Knee extension Lacking 12 deg   Lacking 9 deg Lacking 3-4 deg w/ heel prop   -3 degrees in extension  Knee flexion A: 74 deg P: 80 deg *    A: 100 deg  AA: 104 deg  A: 103 deg AA: 108 deg  (Blank rows = not tested) (Key: WFL = within functional limits not formally assessed, * = concordant pain, s = stiffness/stretching sensation, NT = not tested)  Comments:    LOWER EXTREMITY MMT:   MMT Right eval Left eval  Hip flexion    Hip abduction (modified sitting)    Hip internal rotation    Hip external rotation    Knee flexion    Knee extension    Ankle dorsiflexion     (Blank rows = not tested) (Key: WFL = within functional limits not formally assessed, * = concordant pain, s = stiffness/stretching sensation, NT = not tested)  Comments: deferred on eval given proximity to surgery   FUNCTIONAL TESTS:  TUG: 28.57sec RW; difficulty with initial ascent for STS, reduced WB through surgical limb   GAIT: Distance walked: within clinic Assistive device utilized: Environmental Consultant - 2 wheeled Level of assistance: Modified independence Comments: step to pattern, fwd flexed posture, reduced gait speed/cadence, reduced knee ROM throughout all phases of gait                                                                                                                                TREATMENT:   [facility] Adult PT Treatment:                                                DATE: 08/27/24 Therapeutic Exercise: Supine SLR R side with quad set x 10 reps Seated LAQ x 5 reps Heel slides R with 5 second hold at end range x 10 reps Isometric knee flexion/extension into physioball Isometrics knee flexion/extension x 5 reps x 5 second holds Prone Knee flexion HS curls x 8 reps Manual Therapy: PROM knee flexion/extension (knee to chest supine) Therapeutic Activity: Nustep level 5 x 4 minutes with Ues/Les for warm up Standing heel raises Standing heel cord stretch  Sidestepping x 10 feet x 3 reps R and L Single leg standing R and  L Gait: Gait emphasizing R heel strike--patient has lateral stepping to correct balance during gait activities Gait mechanics with SPC discussed and reviewed Gait x 60 feet x 3 reps in clinic Backwards walking with SBA to CGA due to imbalance-- cues to widen base of support  Slow marching with CGA due to imbalance   [facility] Adult PT Treatment:                                                DATE: 08/22/2024 Therapeutic Exercise: Supine HS/calf stretch with strap  Supine ITB stretch with strap Neuromuscular re-ed: Seated: Quad set 10 x 5 sec Small range SLR 10 x 3 sec Bosu TKE push 2x10 for quad activation  Therapeutic Activity: NuStep L3 x 6 min --> gradually increasing knee flexion mobility as tolerated Seated heel slides for knee flexion AROM 10 x 5 sec Gait training with RW --> focusing on heel strike    [facility] Adult PT Treatment:                                                DATE: 08/20/24 Therapeutic Exercise: Seated heel slides w/ strap and pillow case x15  Propped quad set + calf stretch 2x10; 5sec hold  ROM + education HEP education/discussion  Neuromuscular re-ed: 2 inch fwd step up w BIL UE support 2x10; emphasis on closed chain quad activation  Cone taps, alternating x10 BIL cues for posture and sagittal plane activation  Bosu TKE push 2x10 RLE cues for quad activation  Self Care: Education/discussion re: appropriate positioning, icing regimen, continuing to monitor surgical site, activity modification based on symptom response    [facility] Adult PT Treatment:                                                DATE: 08/16/24 Therapeutic Exercise: Seated hamstring/calf stretch w/ strap and stool 3x30sec  Seated heel slides w/ strap and pillow case x12 Standing heel raises x10 w RW cues for pacing Unresisted LAQ x10 RLE only HEP update + education/handout   Neuromuscular re-ed: Propped quat set w/ stool 2x10 tactile/verbal cues  Fwd/retro stepping RLE stance w/ RW x15  total cues for closed chain stability and weight shifting   Self Care: RW adjustment (posture), continued RW use, gradual progression of activity and pacing of tasks to mitigate quad fatigue, appropriate icing regimen, continued frequent/short bouts of mobility throughout day    PATIENT EDUCATION:  Education details: rationale for interventions, HEP  Person educated: Patient Education method: Explanation, Demonstration, Tactile cues, Verbal cues Education comprehension: verbalized understanding, returned demonstration, verbal cues required, tactile cues required, and needs further education     HOME EXERCISE PROGRAM: Access Code: [email or web address] URL: [email or web address] Date: 08/27/2024 Prepared by: Tawni Ferrier  Exercises - Seated Heel Slide  - 1-2 x daily - 5 x weekly - 1 sets - 8-10 reps - Seated Quad Set  - 1-2 x daily - 5 x weekly - 1 sets - 8-10 reps - Seated Hamstring  Stretch with Strap  - 1-2 x daily - 5 x weekly - 1 sets - 3 reps - Heel Raises with Counter Support  - 1-2 x daily - 5 x weekly - 1 sets - 8 reps - Standing Gastroc Stretch  - 1-2 x daily - 5 x weekly - 1 sets - 2 reps - 30 seconds hold - Standing Single Leg Stance with Counter Support  - 1-2 x daily - 5 x weekly - 1 sets - 3 reps - 10 seconds hold  ASSESSMENT:  CLINICAL IMPRESSION:  The patient is progressing gait arriving today with SPC. PT worked on airline pilot with SPC. We also worked on lateral and backwards steps and heel strike to normalize gait mechanics. PT to continue progressing HEP to patient tolerance. Patient tolerated activities in therapy well today without significant increase in pain.   Per eval: Patient is a pleasant 68 y.o. woman who was seen today for physical therapy evaluation and treatment for R TKA DOS 08/10/24. Pt endorses difficulty w/ functional mobility and ADLs as expected post op. On exam she demonstrates expected limitations in knee mobility/strength,  altered gait/transfer mechanics. Fall risk evidenced by TUG. No concerning features at this time, self care education as above. Tolerates exam/HEP well without adverse event. Recommend skilled PT to address aforementioned deficits with aim of improving functional tolerance and reducing pain with typical activities. Pt departs today's session in no acute distress, all voiced concerns/questions addressed appropriately from PT perspective.      OBJECTIVE IMPAIRMENTS: Abnormal gait, decreased activity tolerance, decreased balance, decreased endurance, decreased mobility, difficulty walking, decreased ROM, decreased strength, hypomobility, increased edema, impaired perceived functional ability, and pain.   ACTIVITY LIMITATIONS: carrying, lifting, standing, squatting, sleeping, stairs, transfers, and locomotion level  PARTICIPATION LIMITATIONS: meal prep, cleaning, laundry, driving, and community activity  PERSONAL FACTORS: Time since onset of injury/illness/exacerbation and 3+ comorbidities: HTN, prior TKA, hx thyroid  cancer are also affecting patient's functional outcome.   REHAB POTENTIAL: Good  CLINICAL DECISION MAKING: Stable/uncomplicated  EVALUATION COMPLEXITY: Low   GOALS:  SHORT TERM GOALS: Target date: 09/10/2024  Pt will demonstrate appropriate understanding and performance of initially prescribed HEP in order to facilitate improved independence with management of symptoms.  Baseline: HEP established  Goal status: INITIAL   2. Pt will report at least 25% improvement in overall pain levels over past week in order to facilitate improved tolerance to typical daily activities.   Baseline: 0-7/10  Goal status: INITIAL    LONG TERM GOALS: Target date: 10/08/2024   Pt will score 40/80 or greater on LEFS in order to demonstrate improved perception of function due to symptoms (MCID 9 pts) Baseline: 14/80 Goal status: INITIAL  2.  Pt will demonstrate at least 0-110 degrees of knee AROM  on surgical limb in order to facilitate improved tolerance to functional movements such as squatting, walking, and stair navigation.  Baseline: see ROM chart above Goal status: INITIAL  3.  Pt will demonstrate appropriate performance of final prescribed HEP in order to facilitate improved self-management of symptoms post-discharge.   Baseline: initial HEP prescribed   Goal status: INITIAL    4.  Pt will be able to perform TUG in less than or equal to 14 sec with LRAD in order to indicate reduced risk of falling (cutoff score for fall risk 13.5 sec in community dwelling older adults per [facility] et al, 2000)  Baseline: 29sec RW  Goal status: INITIAL    5. Pt will report  at least 50% decrease in overall pain levels in past week in order to facilitate improved tolerance to basic ADLs/mobility.   Baseline: 0-8/10  Goal status: INITIAL    6. Pt will demonstrate symmetrical knee MMT between surgical and non-surgical limb in order to facilitate improved functional strength and mechanics.  Baseline: deferred on eval given proximity to surgery  Goal status: INITIAL     PLAN:  PT FREQUENCY: 2x/week  PT DURATION: 8 weeks  PLANNED INTERVENTIONS: 97164- PT Re-evaluation, 97750- Physical Performance Testing, 97110-Therapeutic exercises, 97530- Therapeutic activity, [id number]- Neuromuscular re-education, 97535- Self Care, [id number]- Manual therapy, [id number]- Gait training, [phone]- Vasopneumatic device, [phone] (1-2 muscles), [id number] (3+ muscles)- Dry Needling, Patient/Family education, Balance training, Stair training, Taping, Joint mobilization, Scar mobilization, Cryotherapy, and Moist heat  PLAN FOR NEXT SESSION:Try vaso next visit?  (Pt to ice at home on 08/27/24), Review/update HEP PRN. Work on [employer] exercises as appropriate with emphasis on knee flex/ext ROM, quad activation, edema management. Symptom modification strategies as indicated/appropriate. Mindful of cancer hx   Lamarr Price,  PTA 08/27/2024 1:01 PM

## 2024-08-29 NOTE — Therapy (Signed)
 OUTPATIENT PHYSICAL THERAPY TREATMENT   Patient Name: Yvonne Morris MRN: [id number] DOB:Jun 03, 1956, 68 y.o., female Today's Date: 08/30/2024  END OF SESSION:  PT End of Session - 08/30/24 1107     Visit Number 6    Number of Visits 17    Date for Recertification  10/08/24    Authorization Type [employer] + medicare    Progress Note Due on Visit 10    PT Start Time 1106    PT Stop Time 1144    PT Time Calculation (min) 38 min           Past Medical History:  Diagnosis Date   Hypertension    Thyroid  cancer (HCC)    Past Surgical History:  Procedure Laterality Date   ABDOMINAL HYSTERECTOMY  1999   ABDOMINAL HYSTERECTOMY     CHOLECYSTECTOMY  03/08/2012   Procedure: LAPAROSCOPIC CHOLECYSTECTOMY WITH INTRAOPERATIVE CHOLANGIOGRAM;  Surgeon: Krystal JINNY Russell, MD;  Location: [facility];  Service: General;  Laterality: N/A;   CHOLECYSTECTOMY     THYROIDECTOMY     TOTAL THYROIDECTOMY  2011, 2012   x2   Patient Active Problem List   Diagnosis Date Noted   Hurthle cell neoplasm of thyroid , minimally invasive 08/30/2011    PCP: Janey Santos, MD   REFERRING PROVIDER: Edna Toribio LABOR, MD   REFERRING DIAG: [phone] (ICD-10-CM) - Presence of right artificial knee joint   THERAPY DIAG:  Right knee pain, unspecified chronicity  Muscle weakness (generalized)  Other abnormalities of gait and mobility  Localized edema  Rationale for Evaluation and Treatment: Rehabilitation  ONSET DATE: R TKA DOS 08/10/24  SUBJECTIVE:   Per eval: Pt endorses chronic history of knee pain, retired in May. Tried cortisone injections without much reported relief and elected for R TKA DOS 08/10/24. Has been ambulating with RW since surgery, trying to get up throughout the day. Husband assisting with ADLs. Hasn't been icing much, has done a little bit of her prehab HEP. Difficulty sleeping.   SUBJECTIVE STATEMENT: 08/30/2024: states she was feeling pretty sore after last session, lateral knee.  Notes she wasn't really feeling anything during session, improved some today. 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 No  LIVING ENVIRONMENT: Lives w/ spouse in 2 level home, 12 steps inside w/ R rail. 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 Be able to travel with husband when he lorrin QUITTER MD VISIT: Oct 30th   OBJECTIVE:  Note: Objective measures were completed at Evaluation unless otherwise noted.  DIAGNOSTIC FINDINGS:  S/p R TKA DOS 08/10/24  PATIENT SURVEYS:  LEFS: 14/80   COGNITION: Overall cognitive status: Within functional limits for tasks assessed     SENSATION: Has not noticed numbness  EDEMA/INSPECTION:  Incision obscured by bandage and compression stockings -  no excessive drainage, no apparent erythema or temperature changes No calf pain with DF or palpation, no excessive warmth  Edema measurement:  RLE tib tub 41.5   LLE tib tub 39    PALPATION: Gross tenderness about R distal quad  LOWER EXTREMITY ROM:     Right eval Left eval R 08/16/24 R 08/20/24 R 08/27/24  Hip flexion       Hip extension       Hip internal rotation       Hip external rotation       Knee extension Lacking 12 deg  Lacking 9 deg Lacking 3-4 deg w/ heel prop   -3 degrees in extension  Knee flexion  A: 74 deg P: 80 deg *    A: 100 deg  AA: 104 deg  A: 103 deg AA: 108 deg  (Blank rows = not tested) (Key: WFL = within functional limits not formally assessed, * = concordant pain, s = stiffness/stretching sensation, NT = not tested)  Comments:    LOWER EXTREMITY MMT:   MMT Right eval Left eval  Hip flexion    Hip abduction (modified sitting)    Hip internal rotation    Hip external rotation    Knee flexion    Knee extension    Ankle dorsiflexion     (Blank rows = not tested) (Key: WFL = within functional limits not formally assessed, * = concordant pain, s = stiffness/stretching sensation, NT = not tested)  Comments: deferred on eval given proximity to surgery   FUNCTIONAL TESTS:  TUG: 28.57sec RW; difficulty with initial ascent for STS, reduced WB through surgical limb   GAIT: Distance walked: within clinic Assistive device utilized: [employer] - 2 wheeled Level of assistance: Modified independence Comments: step to pattern, fwd flexed posture, reduced gait speed/cadence, reduced knee ROM throughout all phases of gait                                                                                                                                TREATMENT:   [facility] Adult PT Treatment:                                                DATE: 08/30/24 Therapeutic Exercise: LAQ x8 cues for comfortable Rom and foot positioning Heel slide w/ slider 2x12  Seated quad set propped on stool w/ strap assist 2x10  Supine DKTC w/ pball 2x12 cues for breath control HEP discussion/education  Neuromuscular re-ed: Fwd/retro stepping LLE w/ RLE as stance; 2x10 weaning UE support cues for sequencing and posture Knee flexed at 90 deg hip flex<>ext LLE x12 w UE support  Bosu  TKE push + iso hold x10    [facility] Adult PT Treatment:  DATE: 08/27/24 Therapeutic Exercise: Supine SLR R side with quad set x 10 reps Seated LAQ x 5 reps Heel slides R with 5  second hold at end range x 10 reps Isometric knee flexion/extension into physioball Isometrics knee flexion/extension x 5 reps x 5 second holds Prone Knee flexion HS curls x 8 reps Manual Therapy: PROM knee flexion/extension (knee to chest supine) Therapeutic Activity: Nustep level 5 x 4 minutes with Ues/Les for warm up Standing heel raises Standing heel cord stretch Sidestepping x 10 feet x 3 reps R and L Single leg standing R and L Gait: Gait emphasizing R heel strike--patient has lateral stepping to correct balance during gait activities Gait mechanics with SPC discussed and reviewed Gait x 60 feet x 3 reps in clinic Backwards walking with SBA to CGA due to imbalance-- cues to widen base of support  Slow marching with CGA due to imbalance   [facility] Adult PT Treatment:                                                DATE: 08/22/2024 Therapeutic Exercise: Supine HS/calf stretch with strap  Supine ITB stretch with strap Neuromuscular re-ed: Seated: Quad set 10 x 5 sec Small range SLR 10 x 3 sec Bosu TKE push 2x10 for quad activation  Therapeutic Activity: NuStep L3 x 6 min --> gradually increasing knee flexion mobility as tolerated Seated heel slides for knee flexion AROM 10 x 5 sec Gait training with RW --> focusing on heel strike    [facility] Adult PT Treatment:                                                DATE: 08/20/24 Therapeutic Exercise: Seated heel slides w/ strap and pillow case x15  Propped quad set + calf stretch 2x10; 5sec hold  ROM + education HEP education/discussion  Neuromuscular re-ed: 2 inch fwd step up w BIL UE support 2x10; emphasis on closed chain quad activation  Cone taps, alternating x10 BIL cues for posture and sagittal plane activation  Bosu TKE push 2x10 RLE cues for quad activation  Self Care: Education/discussion re: appropriate positioning, icing regimen, continuing to monitor surgical site, activity modification based on symptom  response     PATIENT EDUCATION:  Education details: rationale for interventions, HEP  Person educated: Patient Education method: Explanation, Demonstration, Tactile cues, Verbal cues Education comprehension: verbalized understanding, returned demonstration, verbal cues required, tactile cues required, and needs further education     HOME EXERCISE PROGRAM: Access Code: [email or web address] URL: [email or web address] Date: 08/27/2024 Prepared by: Tawni Ferrier  Exercises - Seated Heel Slide  - 1-2 x daily - 5 x weekly - 1 sets - 8-10 reps - Seated Quad Set  - 1-2 x daily - 5 x weekly - 1 sets - 8-10 reps - Seated Hamstring Stretch with Strap  - 1-2 x daily - 5 x weekly - 1 sets - 3 reps - Heel Raises with Counter Support  - 1-2 x daily - 5 x weekly - 1 sets - 8 reps - Standing Gastroc Stretch  - 1-2 x daily - 5 x weekly - 1 sets - 2 reps - 30 seconds hold - Standing Single Leg Stance with Counter Support  -  1-2 x daily - 5 x weekly - 1 sets - 3 reps - 10 seconds hold  ASSESSMENT:  CLINICAL IMPRESSION:  08/30/2024: Pt arrives w/ report of significant soreness over past few days, 3/10 aching at present. Given this, we modify today's program to focus on comfortable mobility to mitigate soreness/stiffness which she does well with. Most difficulty with quad focused activities. No adverse events, reports good improvement on departure (most relief from TKE). Recommend continuing along current POC in order to address relevant deficits and improve functional tolerance. Pt departs today's session in no acute distress, all voiced questions/concerns addressed appropriately from PT perspective.     Per eval: Patient is a pleasant 68 y.o. woman who was seen today for physical therapy evaluation and treatment for R TKA DOS 08/10/24. Pt endorses difficulty w/ functional mobility and ADLs as expected post op. On exam she demonstrates expected limitations in knee mobility/strength, altered  gait/transfer mechanics. Fall risk evidenced by TUG. No concerning features at this time, self care education as above. Tolerates exam/HEP well without adverse event. Recommend skilled PT to address aforementioned deficits with aim of improving functional tolerance and reducing pain with typical activities. Pt departs today's session in no acute distress, all voiced concerns/questions addressed appropriately from PT perspective.      OBJECTIVE IMPAIRMENTS: Abnormal gait, decreased activity tolerance, decreased balance, decreased endurance, decreased mobility, difficulty walking, decreased ROM, decreased strength, hypomobility, increased edema, impaired perceived functional ability, and pain.   ACTIVITY LIMITATIONS: carrying, lifting, standing, squatting, sleeping, stairs, transfers, and locomotion level  PARTICIPATION LIMITATIONS: meal prep, cleaning, laundry, driving, and community activity  PERSONAL FACTORS: Time since onset of injury/illness/exacerbation and 3+ comorbidities: HTN, prior TKA, hx thyroid  cancer are also affecting patient's functional outcome.   REHAB POTENTIAL: Good  CLINICAL DECISION MAKING: Stable/uncomplicated  EVALUATION COMPLEXITY: Low   GOALS:  SHORT TERM GOALS: Target date: 09/10/2024  Pt will demonstrate appropriate understanding and performance of initially prescribed HEP in order to facilitate improved independence with management of symptoms.  Baseline: HEP established  Goal status: INITIAL   2. Pt will report at least 25% improvement in overall pain levels over past week in order to facilitate improved tolerance to typical daily activities.   Baseline: 0-7/10  Goal status: INITIAL    LONG TERM GOALS: Target date: 10/08/2024   Pt will score 40/80 or greater on LEFS in order to demonstrate improved perception of function due to symptoms (MCID 9 pts) Baseline: 14/80 Goal status: INITIAL  2.  Pt will demonstrate at least 0-110 degrees of knee AROM on  surgical limb in order to facilitate improved tolerance to functional movements such as squatting, walking, and stair navigation.  Baseline: see ROM chart above Goal status: INITIAL  3.  Pt will demonstrate appropriate performance of final prescribed HEP in order to facilitate improved self-management of symptoms post-discharge.   Baseline: initial HEP prescribed   Goal status: INITIAL    4.  Pt will be able to perform TUG in less than or equal to 14 sec with LRAD in order to indicate reduced risk of falling (cutoff score for fall risk 13.5 sec in community dwelling older adults per [facility] et al, 2000)  Baseline: 29sec RW  Goal status: INITIAL    5. Pt will report at least 50% decrease in overall pain levels in past week in order to facilitate improved tolerance to basic ADLs/mobility.   Baseline: 0-8/10  Goal status: INITIAL    6. Pt will demonstrate symmetrical knee MMT between  surgical and non-surgical limb in order to facilitate improved functional strength and mechanics.  Baseline: deferred on eval given proximity to surgery  Goal status: INITIAL     PLAN:  PT FREQUENCY: 2x/week  PT DURATION: 8 weeks  PLANNED INTERVENTIONS: [id number]- PT Re-evaluation, 97750- Physical Performance Testing, 97110-Therapeutic exercises, 97530- Therapeutic activity, [id number]- Neuromuscular re-education, 97535- Self Care, [id number]- Manual therapy, [id number]- Gait training, [phone]- Vasopneumatic device, [phone] (1-2 muscles), [id number] (3+ muscles)- Dry Needling, Patient/Family education, Balance training, Stair training, Taping, Joint mobilization, Scar mobilization, Cryotherapy, and Moist heat  PLAN FOR NEXT SESSION:Try vaso next visit?  (Pt to ice at home on 08/27/24), Review/update HEP PRN. Work on [employer] exercises as appropriate with emphasis on knee flex/ext ROM, quad activation, edema management. Symptom modification strategies as indicated/appropriate. Mindful of cancer hx   Alm DELENA Jenny PT,  DPT 08/30/2024 11:46 AM

## 2024-08-30 ENCOUNTER — Other Ambulatory Visit: Payer: Self-pay | Admitting: Internal Medicine

## 2024-08-30 ENCOUNTER — Encounter: Payer: Self-pay | Admitting: Physical Therapy

## 2024-08-30 ENCOUNTER — Ambulatory Visit: Admitting: Physical Therapy

## 2024-08-30 DIAGNOSIS — M6281 Muscle weakness (generalized): Secondary | ICD-10-CM

## 2024-08-30 DIAGNOSIS — R600 Localized edema: Secondary | ICD-10-CM

## 2024-08-30 DIAGNOSIS — R2689 Other abnormalities of gait and mobility: Secondary | ICD-10-CM

## 2024-08-30 DIAGNOSIS — M25561 Pain in right knee: Secondary | ICD-10-CM | POA: Diagnosis not present

## 2024-08-30 DIAGNOSIS — I722 Aneurysm of renal artery: Secondary | ICD-10-CM

## 2024-09-03 ENCOUNTER — Ambulatory Visit: Admitting: Physical Therapy

## 2024-09-03 NOTE — Therapy (Incomplete)
 OUTPATIENT PHYSICAL THERAPY TREATMENT   Patient Name: Yvonne Morris MRN: [id number] DOB:September 08, 1956, 68 y.o., female Today's Date: 09/03/2024  END OF SESSION:     Past Medical History:  Diagnosis Date   Hypertension    Thyroid  cancer [facility])    Past Surgical History:  Procedure Laterality Date   ABDOMINAL HYSTERECTOMY  1999   ABDOMINAL HYSTERECTOMY     CHOLECYSTECTOMY  03/08/2012   Procedure: LAPAROSCOPIC CHOLECYSTECTOMY WITH INTRAOPERATIVE CHOLANGIOGRAM;  Surgeon: Krystal JINNY Russell, MD;  Location: [facility];  Service: General;  Laterality: N/A;   CHOLECYSTECTOMY     THYROIDECTOMY     TOTAL THYROIDECTOMY  2011, 2012   x2   Patient Active Problem List   Diagnosis Date Noted   Hurthle cell neoplasm of thyroid , minimally invasive 08/30/2011    PCP: Janey Santos, MD   REFERRING PROVIDER: Edna Toribio LABOR, MD   REFERRING DIAG: [phone] (ICD-10-CM) - Presence of right artificial knee joint   THERAPY DIAG:  No diagnosis found.  Rationale for Evaluation and Treatment: Rehabilitation  ONSET DATE: R TKA DOS 08/10/24  SUBJECTIVE:   Per eval: Pt endorses chronic history of knee pain, retired in May. Tried cortisone injections without much reported relief and elected for R TKA DOS 08/10/24. Has been ambulating with RW since surgery, trying to get up throughout the day. Husband assisting with ADLs. Hasn't been icing much, has done a little bit of her prehab HEP. Difficulty sleeping.   SUBJECTIVE STATEMENT: 09/03/2024: ***  *** states she was feeling pretty sore after last session, lateral knee. Notes she wasn't really feeling anything during session, improved some today. 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 No  LIVING ENVIRONMENT: Lives w/ spouse in 2 level home, 12 steps inside w/ R rail. 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 Be able to travel with husband when he lorrin QUITTER MD VISIT: Oct 30th   OBJECTIVE:  Note: Objective measures were completed at Evaluation unless otherwise noted.  DIAGNOSTIC FINDINGS:  S/p R TKA DOS 08/10/24  PATIENT SURVEYS:  LEFS: 14/80   COGNITION: Overall cognitive status: Within functional limits for tasks assessed     SENSATION: Has not noticed numbness  EDEMA/INSPECTION:  Incision obscured by bandage and compression stockings - no excessive drainage, no apparent erythema or temperature changes No calf pain with DF or palpation, no excessive warmth  Edema measurement:  RLE tib tub 41.5   LLE tib tub 39    PALPATION: Gross tenderness about R distal quad  LOWER EXTREMITY ROM:     Right eval Left eval R 08/16/24 R 08/20/24 R 08/27/24  Hip flexion       Hip extension       Hip internal rotation  Hip external rotation       Knee extension Lacking 12 deg  Lacking 9 deg Lacking 3-4 deg w/ heel prop   -3 degrees in extension  Knee flexion A: 74 deg P: 80 deg *    A: 100 deg  AA: 104 deg  A: 103 deg AA: 108 deg  (Blank rows = not tested) (Key: WFL = within functional limits not formally assessed, * = concordant pain, s = stiffness/stretching sensation, NT = not tested)  Comments:    LOWER EXTREMITY MMT:   MMT Right eval Left eval  Hip flexion    Hip abduction (modified sitting)    Hip internal rotation     Hip external rotation    Knee flexion    Knee extension    Ankle dorsiflexion     (Blank rows = not tested) (Key: WFL = within functional limits not formally assessed, * = concordant pain, s = stiffness/stretching sensation, NT = not tested)  Comments: deferred on eval given proximity to surgery   FUNCTIONAL TESTS:  TUG: 28.57sec RW; difficulty with initial ascent for STS, reduced WB through surgical limb   GAIT: Distance walked: within clinic Assistive device utilized: [employer] - 2 wheeled Level of assistance: Modified independence Comments: step to pattern, fwd flexed posture, reduced gait speed/cadence, reduced knee ROM throughout all phases of gait                                                                                                                                TREATMENT:   [facility] Adult PT Treatment:                                                DATE: 09/03/24 Therapeutic Exercise: *** Manual Therapy: *** Neuromuscular re-ed: *** Therapeutic Activity: *** Modalities: *** Self Care: ***    [facility] Adult PT Treatment:                                                DATE: 08/30/24 Therapeutic Exercise: LAQ x8 cues for comfortable Rom and foot positioning Heel slide w/ slider 2x12  Seated quad set propped on stool w/ strap assist 2x10  Supine DKTC w/ pball 2x12 cues for breath control HEP discussion/education  Neuromuscular re-ed: Fwd/retro stepping LLE w/ RLE as stance; 2x10 weaning UE support cues for sequencing and posture Knee flexed at 90 deg hip flex<>ext LLE x12 w UE support  Bosu  TKE push + iso hold x10    [facility] Adult PT Treatment:  DATE: 08/27/24 Therapeutic Exercise: Supine SLR R side with quad set x 10 reps Seated LAQ x 5 reps Heel slides R with 5 second hold at end range x 10 reps Isometric knee flexion/extension into physioball Isometrics knee flexion/extension x 5 reps x 5 second  holds Prone Knee flexion HS curls x 8 reps Manual Therapy: PROM knee flexion/extension (knee to chest supine) Therapeutic Activity: Nustep level 5 x 4 minutes with Ues/Les for warm up Standing heel raises Standing heel cord stretch Sidestepping x 10 feet x 3 reps R and L Single leg standing R and L Gait: Gait emphasizing R heel strike--patient has lateral stepping to correct balance during gait activities Gait mechanics with SPC discussed and reviewed Gait x 60 feet x 3 reps in clinic Backwards walking with SBA to CGA due to imbalance-- cues to widen base of support  Slow marching with CGA due to imbalance   [facility] Adult PT Treatment:                                                DATE: 08/22/2024 Therapeutic Exercise: Supine HS/calf stretch with strap  Supine ITB stretch with strap Neuromuscular re-ed: Seated: Quad set 10 x 5 sec Small range SLR 10 x 3 sec Bosu TKE push 2x10 for quad activation  Therapeutic Activity: NuStep L3 x 6 min --> gradually increasing knee flexion mobility as tolerated Seated heel slides for knee flexion AROM 10 x 5 sec Gait training with RW --> focusing on heel strike    PATIENT EDUCATION:  Education details: rationale for interventions, HEP  Person educated: Patient Education method: Explanation, Demonstration, Tactile cues, Verbal cues Education comprehension: verbalized understanding, returned demonstration, verbal cues required, tactile cues required, and needs further education     HOME EXERCISE PROGRAM: Access Code: [email or web address] URL: [email or web address] Date: 08/27/2024 Prepared by: Tawni Ferrier  Exercises - Seated Heel Slide  - 1-2 x daily - 5 x weekly - 1 sets - 8-10 reps - Seated Quad Set  - 1-2 x daily - 5 x weekly - 1 sets - 8-10 reps - Seated Hamstring Stretch with Strap  - 1-2 x daily - 5 x weekly - 1 sets - 3 reps - Heel Raises with Counter Support  - 1-2 x daily - 5 x weekly - 1 sets - 8 reps - Standing  Gastroc Stretch  - 1-2 x daily - 5 x weekly - 1 sets - 2 reps - 30 seconds hold - Standing Single Leg Stance with Counter Support  - 1-2 x daily - 5 x weekly - 1 sets - 3 reps - 10 seconds hold  ASSESSMENT:  CLINICAL IMPRESSION:  09/03/2024: ***  ***  Pt arrives w/ report of significant soreness over past few days, 3/10 aching at present. Given this, we modify today's program to focus on comfortable mobility to mitigate soreness/stiffness which she does well with. Most difficulty with quad focused activities. No adverse events, reports good improvement on departure (most relief from TKE). Recommend continuing along current POC in order to address relevant deficits and improve functional tolerance. Pt departs today's session in no acute distress, all voiced questions/concerns addressed appropriately from PT perspective.     Per eval: Patient is a pleasant 68 y.o. woman who was seen today for physical therapy evaluation and treatment for R TKA DOS 08/10/24. Pt endorses difficulty w/ functional  mobility and ADLs as expected post op. On exam she demonstrates expected limitations in knee mobility/strength, altered gait/transfer mechanics. Fall risk evidenced by TUG. No concerning features at this time, self care education as above. Tolerates exam/HEP well without adverse event. Recommend skilled PT to address aforementioned deficits with aim of improving functional tolerance and reducing pain with typical activities. Pt departs today's session in no acute distress, all voiced concerns/questions addressed appropriately from PT perspective.      OBJECTIVE IMPAIRMENTS: Abnormal gait, decreased activity tolerance, decreased balance, decreased endurance, decreased mobility, difficulty walking, decreased ROM, decreased strength, hypomobility, increased edema, impaired perceived functional ability, and pain.   ACTIVITY LIMITATIONS: carrying, lifting, standing, squatting, sleeping, stairs, transfers, and  locomotion level  PARTICIPATION LIMITATIONS: meal prep, cleaning, laundry, driving, and community activity  PERSONAL FACTORS: Time since onset of injury/illness/exacerbation and 3+ comorbidities: HTN, prior TKA, hx thyroid  cancer are also affecting patient's functional outcome.   REHAB POTENTIAL: Good  CLINICAL DECISION MAKING: Stable/uncomplicated  EVALUATION COMPLEXITY: Low   GOALS:  SHORT TERM GOALS: Target date: 09/10/2024  Pt will demonstrate appropriate understanding and performance of initially prescribed HEP in order to facilitate improved independence with management of symptoms.  Baseline: HEP established  Goal status: INITIAL   2. Pt will report at least 25% improvement in overall pain levels over past week in order to facilitate improved tolerance to typical daily activities.   Baseline: 0-7/10  Goal status: INITIAL    LONG TERM GOALS: Target date: 10/08/2024   Pt will score 40/80 or greater on LEFS in order to demonstrate improved perception of function due to symptoms (MCID 9 pts) Baseline: 14/80 Goal status: INITIAL  2.  Pt will demonstrate at least 0-110 degrees of knee AROM on surgical limb in order to facilitate improved tolerance to functional movements such as squatting, walking, and stair navigation.  Baseline: see ROM chart above Goal status: INITIAL  3.  Pt will demonstrate appropriate performance of final prescribed HEP in order to facilitate improved self-management of symptoms post-discharge.   Baseline: initial HEP prescribed   Goal status: INITIAL    4.  Pt will be able to perform TUG in less than or equal to 14 sec with LRAD in order to indicate reduced risk of falling (cutoff score for fall risk 13.5 sec in community dwelling older adults per Warren et al, 2000)  Baseline: 29sec RW  Goal status: INITIAL    5. Pt will report at least 50% decrease in overall pain levels in past week in order to facilitate improved tolerance to basic  ADLs/mobility.   Baseline: 0-8/10  Goal status: INITIAL    6. Pt will demonstrate symmetrical knee MMT between surgical and non-surgical limb in order to facilitate improved functional strength and mechanics.  Baseline: deferred on eval given proximity to surgery  Goal status: INITIAL     PLAN:  PT FREQUENCY: 2x/week  PT DURATION: 8 weeks  PLANNED INTERVENTIONS: [id number]- PT Re-evaluation, 97750- Physical Performance Testing, 97110-Therapeutic exercises, 97530- Therapeutic activity, [id number]- Neuromuscular re-education, 97535- Self Care, [id number]- Manual therapy, [id number]- Gait training, [phone]- Vasopneumatic device, [phone] (1-2 muscles), [id number] (3+ muscles)- Dry Needling, Patient/Family education, Balance training, Stair training, Taping, Joint mobilization, Scar mobilization, Cryotherapy, and Moist heat  PLAN FOR NEXT SESSION:Try vaso next visit?  (Pt to ice at home on 08/27/24), Review/update HEP PRN. Work on [employer] exercises as appropriate with emphasis on knee flex/ext ROM, quad activation, edema management. Symptom modification strategies as indicated/appropriate. Mindful of cancer hx   Alm  A Jamire Shabazz PT, DPT 09/03/2024 7:03 AM

## 2024-09-05 ENCOUNTER — Ambulatory Visit
Admission: RE | Admit: 2024-09-05 | Discharge: 2024-09-05 | Disposition: A | Discharge status: Home / Self Care | Source: Ambulatory Visit | Attending: Internal Medicine | Admitting: Internal Medicine

## 2024-09-05 DIAGNOSIS — I722 Aneurysm of renal artery: Secondary | ICD-10-CM

## 2024-09-05 MED ORDER — IOPAMIDOL (ISOVUE-370) INJECTION 76%
80.0000 mL | Freq: Once | INTRAVENOUS | Status: AC | PRN
  Administered 2024-09-05: 80 mL via INTRAVENOUS

## 2024-09-06 ENCOUNTER — Ambulatory Visit: Admitting: Physical Therapy

## 2024-09-06 ENCOUNTER — Encounter: Payer: Self-pay | Admitting: Physical Therapy

## 2024-09-06 DIAGNOSIS — M25561 Pain in right knee: Secondary | ICD-10-CM

## 2024-09-06 DIAGNOSIS — R2689 Other abnormalities of gait and mobility: Secondary | ICD-10-CM

## 2024-09-06 DIAGNOSIS — M6281 Muscle weakness (generalized): Secondary | ICD-10-CM

## 2024-09-06 DIAGNOSIS — R600 Localized edema: Secondary | ICD-10-CM

## 2024-09-06 NOTE — Therapy (Addendum)
 " OUTPATIENT PHYSICAL THERAPY TREATMENT + NO VISIT DISCHARGE SUMMARY (see below)    Patient Name: Yvonne Morris MRN: [id number] DOB:25-Apr-1956, 68 y.o., female Today's Date: 09/06/2024  END OF SESSION:  PT End of Session - 09/06/24 1103     Visit Number 7    Number of Visits 17    Date for Recertification  10/08/24    Authorization Type [employer] + medicare    Progress Note Due on Visit 10    PT Start Time 1103    PT Stop Time 1146    PT Time Calculation (min) 43 min            Past Medical History:  Diagnosis Date   Hypertension    Thyroid  cancer [facility])    Past Surgical History:  Procedure Laterality Date   ABDOMINAL HYSTERECTOMY  10/25/1997   ABDOMINAL HYSTERECTOMY     CHOLECYSTECTOMY  03/08/2012   Procedure: LAPAROSCOPIC CHOLECYSTECTOMY WITH INTRAOPERATIVE CHOLANGIOGRAM;  Surgeon: Krystal JINNY Russell, MD;  Location: [facility];  Service: General;  Laterality: N/A;   CHOLECYSTECTOMY     THYROIDECTOMY     TOTAL THYROIDECTOMY  2011, 2012   x2   Patient Active Problem List   Diagnosis Date Noted   Hurthle cell neoplasm of thyroid , minimally invasive 08/30/2011    PCP: Janey Santos, MD   REFERRING PROVIDER: Edna Toribio LABOR, MD   REFERRING DIAG: [phone] (ICD-10-CM) - Presence of right artificial knee joint   THERAPY DIAG:  Right knee pain, unspecified chronicity  Muscle weakness (generalized)  Other abnormalities of gait and mobility  Localized edema  Rationale for Evaluation and Treatment: Rehabilitation  ONSET DATE: R TKA DOS 08/10/24  SUBJECTIVE:   Per eval: Pt endorses chronic history of knee pain, retired in May. Tried cortisone injections without much reported relief and elected for R TKA DOS 08/10/24. Has been ambulating with RW since surgery, trying to get up throughout the day. Husband assisting with ADLs. Hasn't been icing much, has done a little bit of her prehab HEP. Difficulty sleeping.   SUBJECTIVE STATEMENT: 09/06/2024: states she has  turned a corner. Feeling great. Hasn't used a cane since last week. Doing normal household tasks - cooking/cleaning/laundry. Doing better with steps. 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 No  LIVING ENVIRONMENT: Lives w/ spouse in 2 level home, 12 steps inside w/ R rail. 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 Be able to travel with husband when he lorrin QUITTER MD VISIT: Nov 25th   OBJECTIVE:  Note: Objective measures were completed at Evaluation unless otherwise noted.  DIAGNOSTIC FINDINGS:  S/p R TKA DOS 08/10/24  PATIENT SURVEYS:  LEFS: 14/80   COGNITION: Overall cognitive status: Within functional limits for tasks assessed  SENSATION: Has not noticed numbness  EDEMA/INSPECTION:  Incision obscured by bandage and compression stockings - no excessive drainage, no apparent erythema or temperature changes No calf pain with DF or palpation, no excessive warmth  Edema measurement:  RLE tib tub 41.5   LLE tib tub 39    PALPATION: Gross tenderness about R distal quad  LOWER EXTREMITY ROM:     Right eval Left eval R 08/16/24 R 08/20/24 R 08/27/24 R 09/06/24  Hip flexion        Hip extension        Hip internal rotation        Hip external rotation        Knee  extension Lacking 12 deg  Lacking 9 deg Lacking 3-4 deg w/ heel prop   -3 degrees in extension   Knee flexion A: 74 deg P: 80 deg *    A: 100 deg  AA: 104 deg  A: 103 deg AA: 108 deg A: 115 deg  (Blank rows = not tested) (Key: WFL = within functional limits not formally assessed, * = concordant pain, s = stiffness/stretching sensation, NT = not tested)  Comments:    LOWER EXTREMITY MMT:   MMT Right eval Left eval  Hip flexion    Hip abduction (modified sitting)    Hip internal rotation    Hip external rotation    Knee flexion    Knee extension    Ankle dorsiflexion     (Blank rows = not tested) (Key: WFL = within functional limits not formally assessed, * = concordant pain, s = stiffness/stretching sensation, NT = not tested)  Comments: deferred on eval given proximity to surgery   FUNCTIONAL TESTS:  TUG: 28.57sec RW; difficulty with initial ascent for STS, reduced WB through surgical limb   GAIT: Distance walked: within clinic Assistive device utilized: Walker - 2 wheeled Level of assistance: Modified independence Comments: step to pattern, fwd flexed posture, reduced gait speed/cadence, reduced knee ROM throughout all phases of gait                                                                                                                                TREATMENT:   [facility] Adult PT Treatment:                                                DATE: 09/06/24 Therapeutic Exercise: Nu step LE/UE warm up, L3 6 min during subjective  LAQ 2x10 LLE  RB hamstring curl x20. Blue band x10 RLE Sidelying hip abduction x10 Sidelying hip circles x8 CW/CCW HEP update + education/handout, emphasis on strategies to maximize self efficacy w/ management  Therapeutic Activity: STS from mat; x8, x7 cues for symmetry of WB and pacing strategies  6 inch fwd step up, lateral step up  practice + education on progression/regression, relevant mechanics Education re: continued compensatory  strategies for stair navigation (particularly descent) for comfort/safety given continued difficulty controlling descent     [facility] Adult PT Treatment:                                                DATE: 08/30/24 Therapeutic Exercise: LAQ x8 cues for comfortable Rom and foot positioning Heel slide w/ slider 2x12  Seated quad set propped on stool w/ strap assist 2x10  Supine DKTC w/ pball 2x12 cues for breath control HEP discussion/education  Neuromuscular re-ed: Fwd/retro stepping LLE w/ RLE as stance; 2x10 weaning UE support cues for sequencing and posture Knee flexed at 90 deg hip flex<>ext LLE x12 w UE support  Bosu  TKE push + iso hold x10    [facility] Adult PT Treatment:                                                DATE: 08/27/24 Therapeutic Exercise: Supine SLR R side with quad set x 10 reps Seated LAQ x 5 reps Heel slides R with 5 second hold at end range x 10 reps Isometric knee flexion/extension into physioball Isometrics knee flexion/extension x 5 reps x 5 second holds Prone Knee flexion HS curls x 8 reps Manual Therapy: PROM knee flexion/extension (knee to chest supine) Therapeutic Activity: Nustep level 5 x 4 minutes with Ues/Les for warm up Standing heel raises Standing heel cord stretch Sidestepping x 10 feet x 3 reps R and L Single leg standing R and L Gait: Gait emphasizing R heel strike--patient has lateral stepping to correct balance during gait activities Gait mechanics with SPC discussed and reviewed Gait x 60 feet x 3 reps in clinic Backwards walking with SBA to CGA due to imbalance-- cues to widen base of support  Slow marching with CGA due to imbalance   [facility] Adult PT Treatment:                                                DATE: 08/22/2024 Therapeutic Exercise: Supine HS/calf stretch with strap  Supine ITB stretch with strap Neuromuscular re-ed: Seated: Quad set 10 x 5 sec Small range SLR 10 x 3 sec Bosu TKE push 2x10 for quad  activation  Therapeutic Activity: NuStep L3 x 6 min --> gradually increasing knee flexion mobility as tolerated Seated heel slides for knee flexion AROM 10 x 5 sec Gait training with RW --> focusing on heel strike    PATIENT EDUCATION:  Education details: rationale for interventions, HEP  Person educated: Patient Education method: Explanation, Demonstration, Tactile cues, Verbal cues Education comprehension: verbalized understanding, returned demonstration, verbal cues required, tactile cues required, and needs further education     HOME EXERCISE PROGRAM: Access Code: [email or web address] URL: [email or web address] Date: 09/06/2024 Prepared by: Alm Jenny  Exercises - Seated Heel Slide  - 2-3 x daily - 1 sets - 8-10 reps - Seated Quad Set  - 2-3 x daily - 1 sets - 8-10 reps - Heel Raises with Counter Support  - 2-3 x daily - 1  sets - 8 reps - Sit to Stand with Armchair  - 2-3 x daily - 1 sets - 6-10 reps - Seated Long Arc Quad  - 2-3 x daily - 1 sets - 10-15 reps - Seated Hamstring Curl with Anchored Resistance  - 2-3 x daily - 1 sets - 10-15 reps - Sidelying Hip Circles  - 2-3 x daily - 1 sets - 8-12 reps  ASSESSMENT:  CLINICAL IMPRESSION:  09/06/2024: Pt arrives w/o pain, reports significant improvement in symptoms and function over past week. She states she may feel ready to discharge soon - given this we spend increased time w/ activities today maximizing self efficacy w/ management of exercise and functional mobility. Tolerates well without pain, no adverse events. Flexion AROM with notable improvement. Plan to assess appropriateness of d/c at next visit per discussion w/ pt today. Pt departs today's session in no acute distress, all voiced questions/concerns addressed appropriately from PT perspective.      Per eval: Patient is a pleasant 68 y.o. woman who was seen today for physical therapy evaluation and treatment for R TKA DOS 08/10/24. Pt endorses difficulty w/  functional mobility and ADLs as expected post op. On exam she demonstrates expected limitations in knee mobility/strength, altered gait/transfer mechanics. Fall risk evidenced by TUG. No concerning features at this time, self care education as above. Tolerates exam/HEP well without adverse event. Recommend skilled PT to address aforementioned deficits with aim of improving functional tolerance and reducing pain with typical activities. Pt departs today's session in no acute distress, all voiced concerns/questions addressed appropriately from PT perspective.      OBJECTIVE IMPAIRMENTS: Abnormal gait, decreased activity tolerance, decreased balance, decreased endurance, decreased mobility, difficulty walking, decreased ROM, decreased strength, hypomobility, increased edema, impaired perceived functional ability, and pain.   ACTIVITY LIMITATIONS: carrying, lifting, standing, squatting, sleeping, stairs, transfers, and locomotion level  PARTICIPATION LIMITATIONS: meal prep, cleaning, laundry, driving, and community activity  PERSONAL FACTORS: Time since onset of injury/illness/exacerbation and 3+ comorbidities: HTN, prior TKA, hx thyroid  cancer are also affecting patient's functional outcome.   REHAB POTENTIAL: Good  CLINICAL DECISION MAKING: Stable/uncomplicated  EVALUATION COMPLEXITY: Low   GOALS:  SHORT TERM GOALS: Target date: 09/10/2024  Pt will demonstrate appropriate understanding and performance of initially prescribed HEP in order to facilitate improved independence with management of symptoms.  Baseline: HEP established  Goal status: INITIAL   2. Pt will report at least 25% improvement in overall pain levels over past week in order to facilitate improved tolerance to typical daily activities.   Baseline: 0-7/10  Goal status: INITIAL    LONG TERM GOALS: Target date: 10/08/2024   Pt will score 40/80 or greater on LEFS in order to demonstrate improved perception of function due to  symptoms (MCID 9 pts) Baseline: 14/80 Goal status: INITIAL  2.  Pt will demonstrate at least 0-110 degrees of knee AROM on surgical limb in order to facilitate improved tolerance to functional movements such as squatting, walking, and stair navigation.  Baseline: see ROM chart above Goal status: INITIAL  3.  Pt will demonstrate appropriate performance of final prescribed HEP in order to facilitate improved self-management of symptoms post-discharge.   Baseline: initial HEP prescribed   Goal status: INITIAL    4.  Pt will be able to perform TUG in less than or equal to 14 sec with LRAD in order to indicate reduced risk of falling (cutoff score for fall risk 13.5 sec in community dwelling older adults per [facility] et  al, 2000)  Baseline: 29sec RW  Goal status: INITIAL    5. Pt will report at least 50% decrease in overall pain levels in past week in order to facilitate improved tolerance to basic ADLs/mobility.   Baseline: 0-8/10  Goal status: INITIAL    6. Pt will demonstrate symmetrical knee MMT between surgical and non-surgical limb in order to facilitate improved functional strength and mechanics.  Baseline: deferred on eval given proximity to surgery  Goal status: INITIAL     PLAN:  PT FREQUENCY: 2x/week  PT DURATION: 8 weeks  PLANNED INTERVENTIONS: 97164- PT Re-evaluation, 97750- Physical Performance Testing, 97110-Therapeutic exercises, 97530- Therapeutic activity, 97112- Neuromuscular re-education, 97535- Self Care, [id number]- Manual therapy, [phone]- Gait training, [phone]- Vasopneumatic device, [phone] (1-2 muscles), [id number] (3+ muscles)- Dry Needling, Patient/Family education, Balance training, Stair training, Taping, Joint mobilization, Scar mobilization, Cryotherapy, and Moist heat  PLAN FOR NEXT SESSION: assess appropriateness of d/c vs continuation of POC next visit based on pt trial of independent HEP    Alm DELENA Jenny PT, DPT 09/06/2024 12:39 PM     Discharge addendum  10/12/2024  PHYSICAL THERAPY DISCHARGE SUMMARY  Visits from Start of Care: 7  Current functional level related to goals / functional outcomes: Unable to be assessed   Remaining deficits: Unable to be assessed   Education / Equipment: Unable to be assessed  Patient goals were unable to be assessed. Patient is being discharged due to not returning since the last visit.  Alm DELENA Jenny PT, DPT 10/12/2024 10:00 AM    "

## 2024-09-10 ENCOUNTER — Ambulatory Visit: Admitting: Rehabilitative and Restorative Service Providers"

## 2024-09-12 NOTE — Therapy (Incomplete)
 OUTPATIENT PHYSICAL THERAPY TREATMENT + DISCHARGE ***    Patient Name: Yvonne Morris MRN: [id number] DOB:Apr 27, 1956, 68 y.o., female Today's Date: 09/12/2024   PHYSICAL THERAPY DISCHARGE SUMMARY  Visits from Start of Care: ***  Current functional level related to goals / functional outcomes: ***   Remaining deficits: Pain, altered mechanics ***    Education / Equipment: HEP, discharge education, follow up with provider, gradual progression of activity   Patient agrees to discharge. Patient goals were {OP Goals:25702::met}. Patient is being discharged due to being pleased with the current functional level.   END OF SESSION:      Past Medical History:  Diagnosis Date   Hypertension    Thyroid  cancer [facility])    Past Surgical History:  Procedure Laterality Date   ABDOMINAL HYSTERECTOMY  10/25/1997   ABDOMINAL HYSTERECTOMY     CHOLECYSTECTOMY  03/08/2012   Procedure: LAPAROSCOPIC CHOLECYSTECTOMY WITH INTRAOPERATIVE CHOLANGIOGRAM;  Surgeon: Krystal JINNY Russell, MD;  Location: [facility];  Service: General;  Laterality: N/A;   CHOLECYSTECTOMY     THYROIDECTOMY     TOTAL THYROIDECTOMY  2011, 2012   x2   Patient Active Problem List   Diagnosis Date Noted   Hurthle cell neoplasm of thyroid , minimally invasive 08/30/2011    PCP: Janey Santos, MD   REFERRING PROVIDER: Edna Toribio LABOR, MD   REFERRING DIAG: [id number] (ICD-10-CM) - Presence of right artificial knee joint   THERAPY DIAG:  No diagnosis found.  Rationale for Evaluation and Treatment: Rehabilitation  ONSET DATE: R TKA DOS 08/10/24  SUBJECTIVE:   Per eval: Pt endorses chronic history of knee pain, retired in May. Tried cortisone injections without much reported relief and elected for R TKA DOS 08/10/24. Has been ambulating with RW since surgery, trying to get up throughout the day. Husband assisting with ADLs. Hasn't been icing much, has done a little bit of her prehab HEP. Difficulty sleeping.    SUBJECTIVE STATEMENT: 09/12/2024: ***  *** states she has turned a corner. Feeling great. Hasn't used a cane since last week. Doing normal household tasks - cooking/cleaning/laundry. Doing better with steps. 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 No  LIVING ENVIRONMENT: Lives w/ spouse in 2 level home, 12 steps inside w/ R rail. 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 Be able to travel with husband when he lorrin QUITTER MD VISIT: Nov 25th   OBJECTIVE:  Note: Objective measures were completed at Evaluation unless otherwise noted.  DIAGNOSTIC FINDINGS:  S/p R TKA DOS 08/10/24  PATIENT SURVEYS:  LEFS: 14/80  08/2024 LEFS: ***   COGNITION: Overall cognitive status: Within functional limits for tasks assessed     SENSATION: Has not noticed numbness  EDEMA/INSPECTION:  Incision obscured by bandage and  compression stockings - no excessive drainage, no apparent erythema or temperature changes No calf pain with DF or palpation, no excessive warmth  Edema measurement:  RLE tib tub 41.5   LLE tib tub 39    PALPATION: Gross tenderness about R distal quad  LOWER EXTREMITY ROM:     Right eval Left eval R 08/16/24 R 08/20/24 R 08/27/24 R 09/06/24 R 09/13/24  Hip  flexion         Hip extension         Hip internal rotation         Hip external rotation         Knee extension Lacking 12 deg  Lacking 9 deg Lacking 3-4 deg w/ heel prop   -3 degrees in extension  ***  Knee flexion A: 74 deg P: 80 deg *    A: 100 deg  AA: 104 deg  A: 103 deg AA: 108 deg A: 115 deg ***   (Blank rows = not tested) (Key: WFL = within functional limits not formally assessed, * = concordant pain, s = stiffness/stretching sensation, NT = not tested)  Comments:    LOWER EXTREMITY MMT:   MMT Right eval Left eval R/L 09/13/24 ***   Hip flexion     Hip abduction (modified sitting)     Hip internal rotation     Hip external rotation     Knee flexion     Knee extension     Ankle dorsiflexion      (Blank rows = not tested) (Key: WFL = within functional limits not formally assessed, * = concordant pain, s = stiffness/stretching sensation, NT = not tested)  Comments: deferred on eval given proximity to surgery   FUNCTIONAL TESTS:  TUG: 28.57sec RW; difficulty with initial ascent for STS, reduced WB through surgical limb    09/13/24: - TUG ***   GAIT: Distance walked: within clinic Assistive device utilized: [employer] - 2 wheeled Level of assistance: Modified independence Comments: step to pattern, fwd flexed posture, reduced gait speed/cadence, reduced knee ROM throughout all phases of gait                                                                                                                                TREATMENT:   [facility] Adult PT Treatment:                                                DATE: 09/13/24 Therapeutic Exercise: *** Manual Therapy: *** Neuromuscular re-ed: *** Therapeutic Activity: MSK assessment + education TUG + education Education/discussion re: progress with PT, symptom behavior as it affects activity tolerance, PT goals/POC *** Modalities: *** Self Care: ***     [facility] Adult PT Treatment:  DATE: 09/06/24 Therapeutic Exercise: Nu step LE/UE warm up, L3 6 min during subjective  LAQ 2x10 LLE  RB hamstring curl x20. Blue band x10 RLE Sidelying hip abduction x10 Sidelying hip circles x8 CW/CCW HEP update + education/handout, emphasis on strategies to maximize self efficacy w/ management  Therapeutic Activity: STS from mat; x8, x7 cues for symmetry of WB and pacing strategies  6 inch fwd step up, lateral step up practice + education on progression/regression, relevant mechanics Education re: continued compensatory strategies for stair navigation (particularly descent) for comfort/safety given continued difficulty controlling descent     [facility] Adult PT Treatment:                                                DATE: 08/30/24 Therapeutic Exercise: LAQ x8 cues for comfortable Rom and foot positioning Heel slide w/ slider 2x12  Seated quad set propped on stool w/ strap assist 2x10  Supine DKTC w/ pball 2x12 cues for breath control HEP discussion/education  Neuromuscular re-ed: Fwd/retro stepping LLE w/ RLE as stance; 2x10 weaning UE support cues for sequencing and posture Knee flexed at 90 deg hip flex<>ext LLE x12 w UE support  Bosu  TKE push + iso hold x10    [facility] Adult PT Treatment:                                                DATE: 08/27/24 Therapeutic Exercise: Supine SLR R side with quad set x 10 reps Seated LAQ x 5 reps Heel slides R with 5 second hold at end range x 10 reps Isometric knee flexion/extension into physioball Isometrics knee flexion/extension x 5 reps x 5 second holds Prone Knee flexion HS curls x 8 reps Manual Therapy: PROM knee flexion/extension (knee to chest supine) Therapeutic Activity: Nustep level 5 x 4 minutes with Ues/Les for warm up Standing heel raises Standing heel cord stretch Sidestepping x 10 feet x 3 reps R and L Single leg standing R and L Gait: Gait emphasizing R heel strike--patient has lateral stepping to correct  balance during gait activities Gait mechanics with SPC discussed and reviewed Gait x 60 feet x 3 reps in clinic Backwards walking with SBA to CGA due to imbalance-- cues to widen base of support  Slow marching with CGA due to imbalance   [facility] Adult PT Treatment:                                                DATE: 08/22/2024 Therapeutic Exercise: Supine HS/calf stretch with strap  Supine ITB stretch with strap Neuromuscular re-ed: Seated: Quad set 10 x 5 sec Small range SLR 10 x 3 sec Bosu TKE push 2x10 for quad activation  Therapeutic Activity: NuStep L3 x 6 min --> gradually increasing knee flexion mobility as tolerated Seated heel slides for knee flexion AROM 10 x 5 sec Gait training with RW --> focusing on heel strike    PATIENT EDUCATION:  Education details: PT POC, PT goals, progress with PT thus far, discharge planning, HEP, follow up with provider as indicated Person educated: Patient Education  method: Explanation, Demonstration, Verbal cues Education comprehension: verbalized understanding, returned demonstration   HOME EXERCISE PROGRAM: Access Code: [email or web address] URL: [email or web address] Date: 09/06/2024 Prepared by: Alm Jenny  Exercises - Seated Heel Slide  - 2-3 x daily - 1 sets - 8-10 reps - Seated Quad Set  - 2-3 x daily - 1 sets - 8-10 reps - Heel Raises with Counter Support  - 2-3 x daily - 1 sets - 8 reps - Sit to Stand with Armchair  - 2-3 x daily - 1 sets - 6-10 reps - Seated Long Arc Quad  - 2-3 x daily - 1 sets - 10-15 reps - Seated Hamstring Curl with Anchored Resistance  - 2-3 x daily - 1 sets - 10-15 reps - Sidelying Hip Circles  - 2-3 x daily - 1 sets - 8-12 reps  ASSESSMENT:  CLINICAL IMPRESSION:  09/12/2024: ***  *** Pt arrives w/o pain, reports significant improvement in symptoms and function over past week. She states she may feel ready to discharge soon - given this we spend increased time w/ activities today maximizing  self efficacy w/ management of exercise and functional mobility. Tolerates well without pain, no adverse events. Flexion AROM with notable improvement. Plan to assess appropriateness of d/c at next visit per discussion w/ pt today. Pt departs today's session in no acute distress, all voiced questions/concerns addressed appropriately from PT perspective.      Per eval: Patient is a pleasant 68 y.o. woman who was seen today for physical therapy evaluation and treatment for R TKA DOS 08/10/24. Pt endorses difficulty w/ functional mobility and ADLs as expected post op. On exam she demonstrates expected limitations in knee mobility/strength, altered gait/transfer mechanics. Fall risk evidenced by TUG. No concerning features at this time, self care education as above. Tolerates exam/HEP well without adverse event. Recommend skilled PT to address aforementioned deficits with aim of improving functional tolerance and reducing pain with typical activities. Pt departs today's session in no acute distress, all voiced concerns/questions addressed appropriately from PT perspective.      OBJECTIVE IMPAIRMENTS: Abnormal gait, decreased activity tolerance, decreased balance, decreased endurance, decreased mobility, difficulty walking, decreased ROM, decreased strength, hypomobility, increased edema, impaired perceived functional ability, and pain.   ACTIVITY LIMITATIONS: carrying, lifting, standing, squatting, sleeping, stairs, transfers, and locomotion level  PARTICIPATION LIMITATIONS: meal prep, cleaning, laundry, driving, and community activity  PERSONAL FACTORS: Time since onset of injury/illness/exacerbation and 3+ comorbidities: HTN, prior TKA, hx thyroid  cancer are also affecting patient's functional outcome.   REHAB POTENTIAL: Good  CLINICAL DECISION MAKING: Stable/uncomplicated  EVALUATION COMPLEXITY: Low   GOALS:  SHORT TERM GOALS: Target date: 09/10/2024  Pt will demonstrate appropriate  understanding and performance of initially prescribed HEP in order to facilitate improved independence with management of symptoms.  Baseline: HEP established  09/13/24: ***  Goal status: ***  2. Pt will report at least 25% improvement in overall pain levels over past week in order to facilitate improved tolerance to typical daily activities.   Baseline: 0-7/10  09/13/24: ***   Goal status: ***  LONG TERM GOALS: Target date: 10/08/2024   Pt will score 40/80 or greater on LEFS in order to demonstrate improved perception of function due to symptoms (MCID 9 pts) Baseline: 14/80 09/13/24: ***  Goal status: ***  2.  Pt will demonstrate at least 0-110 degrees of knee AROM on surgical limb in order to facilitate improved tolerance to functional movements such as squatting, walking, and stair navigation.  Baseline: see ROM  chart above 09/13/24: ***  Goal status: ***  3.  Pt will demonstrate appropriate performance of final prescribed HEP in order to facilitate improved self-management of symptoms post-discharge.   Baseline: initial HEP prescribed   09/13/24: ***   Goal status: ***    4.  Pt will be able to perform TUG in less than or equal to 14 sec with LRAD in order to indicate reduced risk of falling (cutoff score for fall risk 13.5 sec in community dwelling older adults per [facility] et al, 2000)  Baseline: 29sec RW  09/13/24: ***   Goal status: ***   5. Pt will report at least 50% decrease in overall pain levels in past week in order to facilitate improved tolerance to basic ADLs/mobility.   Baseline: 0-8/10  09/13/24: ***   Goal status: ***  6. Pt will demonstrate symmetrical knee MMT between surgical and non-surgical limb in order to facilitate improved functional strength and mechanics.  Baseline: deferred on eval given proximity to surgery  09/13/24: ***   Goal status: ***   PLAN:  PT FREQUENCY: 2x/week  PT DURATION: 8 weeks  PLANNED INTERVENTIONS: 97164- PT  Re-evaluation, 97750- Physical Performance Testing, 97110-Therapeutic exercises, 97530- Therapeutic activity, [id number]- Neuromuscular re-education, 97535- Self Care, 02859- Manual therapy, [id number]- Gait training, [phone]- Vasopneumatic device, [phone] (1-2 muscles), [id number] (3+ muscles)- Dry Needling, Patient/Family education, Balance training, Stair training, Taping, Joint mobilization, Scar mobilization, Cryotherapy, and Moist heat  PLAN FOR NEXT SESSION: assess appropriateness of d/c vs continuation of POC next visit based on pt trial of independent HEP    Alm DELENA Jenny PT, DPT 09/12/2024 7:40 AM

## 2024-09-13 ENCOUNTER — Ambulatory Visit: Admitting: Physical Therapy

## 2024-09-17 ENCOUNTER — Ambulatory Visit: Admitting: Physical Therapy

## 2024-09-19 ENCOUNTER — Ambulatory Visit: Attending: Vascular Surgery | Admitting: Vascular Surgery

## 2024-09-19 ENCOUNTER — Encounter: Payer: Self-pay | Admitting: Vascular Surgery

## 2024-09-19 VITALS — BP 120/57 | HR 62 | Temp 97.8°F | Ht 67.0 in | Wt 218.0 lb

## 2024-09-19 DIAGNOSIS — I773 Arterial fibromuscular dysplasia: Secondary | ICD-10-CM | POA: Diagnosis not present

## 2024-09-19 DIAGNOSIS — I722 Aneurysm of renal artery: Secondary | ICD-10-CM

## 2024-09-19 NOTE — Progress Notes (Signed)
 Patient ID: Yvonne Morris, female   DOB: 06-08-1956, 68 y.o.   MRN: [id number]  Reason for Consult: New Patient (Initial Visit)   Referred by Avva, Ravisankar, MD  Subjective:     HPI:  Yvonne Morris is a 68 y.o. female without significant history of vascular disease.  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  Her father did have an aneurysm in his chest but was not a visceral aneurysm she has no other family history of aneurysm disease.  She has no new back or abdominal pain and has normal renal function to her knowledge.  She has been initiated on baby aspirin daily secondary to recent knee replacement but prior to that did not take any antiplatelet medications.  Past Medical History:  Diagnosis Date   Hypertension    Thyroid  cancer (HCC)    Family History  Problem Relation Age of Onset   Stroke Father    Hypertension Brother    Hyperlipidemia Brother    Past Surgical History:  Procedure Laterality Date   ABDOMINAL HYSTERECTOMY  10/25/1997   ABDOMINAL HYSTERECTOMY     CHOLECYSTECTOMY  03/08/2012   Procedure: LAPAROSCOPIC CHOLECYSTECTOMY WITH INTRAOPERATIVE CHOLANGIOGRAM;  Surgeon: Krystal JINNY Russell, MD;  Location: [facility];  Service: General;  Laterality: N/A;   CHOLECYSTECTOMY     THYROIDECTOMY     TOTAL THYROIDECTOMY  2011, 2012   x2    Short Social History:  Social History   Tobacco Use   Smoking status: Never   Smokeless tobacco: Never  Substance Use Topics   Alcohol use: Yes    Comment: 1 glass of wine every couple days per pt    No Known Allergies  Current Outpatient Medications  Medication Sig Dispense Refill   estradiol  (ESTRACE ) 2 MG tablet Take 2 mg by mouth daily.     irbesartan-hydrochlorothiazide  (AVALIDE) 300-12.5 MG tablet Take 1 tablet by mouth daily.     levothyroxine  (SYNTHROID , LEVOTHROID) 175 MCG tablet Take 175 mcg by mouth daily.      metoprolol tartrate (LOPRESSOR) 50 MG tablet Take  50 mg by mouth 2 (two) times daily.     PARoxetine  (PAXIL ) 20 MG tablet Take 20 mg by mouth daily.     No current facility-administered medications for this visit.    Review of Systems  Constitutional:  Constitutional negative. HENT: HENT negative.  Eyes: Eyes negative.  Respiratory: Respiratory negative.  Cardiovascular: Cardiovascular negative.  GI: Gastrointestinal negative.  Musculoskeletal: Musculoskeletal negative.  Skin: Skin negative.  Neurological: Neurological negative. Hematologic: Hematologic/lymphatic negative.  Psychiatric: Psychiatric negative.        Objective:  Objective   Vitals:   09/19/24 1130  BP: (!) 120/57  Pulse: 62  Temp: 97.8 F (36.6 C)  SpO2: 94%  Weight: 218 lb (98.9 kg)  Height: 5' 7 (1.702 m)   Body mass index is 34.14 kg/m.  Physical Exam HENT:     Head: Normocephalic.     Nose: Nose normal.  Eyes:     Pupils: Pupils are equal, round, and reactive to light.  Neck:     Vascular: No carotid bruit.  Cardiovascular:     Rate and Rhythm: Normal rate.     Pulses: Normal pulses.  Pulmonary:     Effort: Pulmonary effort is normal.  Abdominal:     General: Abdomen is flat.     Palpations: Abdomen is soft. There is no mass.  Musculoskeletal:  General: Normal range of motion.     Right lower leg: No edema.     Left lower leg: No edema.  Skin:    Capillary Refill: Capillary refill takes less than 2 seconds.  Neurological:     General: No focal deficit present.     Mental Status: She is alert.  Psychiatric:        Mood and Affect: Mood normal.     Data: CT IMPRESSION: 1. 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  We have discussed that this is exceedingly low risk for rupture particularly given that she is past childbearing age.  The right renal artery demonstrates clear fibromuscular dysplasia and this was demonstrated to the patient today via CT review as well.  As such I will have her follow-up in 1 year with renal artery duplex for evaluation of left renal artery aneurysm and right sided fibromuscular dysplasia and at that time we will also evaluate for carotid artery stenosis given history of fibromuscular dysplasia.  I have asked her to continue baby aspirin therapy daily she demonstrates good understanding we will follow-up in 1 year.     Yvonne Lonni [place] MD [facility]
